# Patient Record
Sex: Male | Born: 1965 | Race: White | Hispanic: No | Marital: Married | Smoking: Never smoker
Health system: Southern US, Community
[De-identification: ages and names within clinical notes are randomized; demographics above are authoritative.]

## PROBLEM LIST (undated history)

## (undated) DIAGNOSIS — K219 Gastro-esophageal reflux disease without esophagitis: Secondary | ICD-10-CM

## (undated) DIAGNOSIS — N401 Enlarged prostate with lower urinary tract symptoms: Secondary | ICD-10-CM

## (undated) DIAGNOSIS — Z87442 Personal history of urinary calculi: Secondary | ICD-10-CM

## (undated) DIAGNOSIS — N400 Enlarged prostate without lower urinary tract symptoms: Secondary | ICD-10-CM

## (undated) DIAGNOSIS — I491 Atrial premature depolarization: Secondary | ICD-10-CM

## (undated) DIAGNOSIS — M5030 Other cervical disc degeneration, unspecified cervical region: Secondary | ICD-10-CM

## (undated) DIAGNOSIS — R002 Palpitations: Secondary | ICD-10-CM

## (undated) DIAGNOSIS — I77810 Thoracic aortic ectasia: Secondary | ICD-10-CM

## (undated) DIAGNOSIS — E785 Hyperlipidemia, unspecified: Secondary | ICD-10-CM

## (undated) DIAGNOSIS — R079 Chest pain, unspecified: Secondary | ICD-10-CM

## (undated) DIAGNOSIS — Z9289 Personal history of other medical treatment: Secondary | ICD-10-CM

## (undated) DIAGNOSIS — R7401 Elevation of levels of liver transaminase levels: Secondary | ICD-10-CM

## (undated) DIAGNOSIS — A419 Sepsis, unspecified organism: Secondary | ICD-10-CM

## (undated) DIAGNOSIS — I10 Essential (primary) hypertension: Secondary | ICD-10-CM

## (undated) DIAGNOSIS — J8410 Pulmonary fibrosis, unspecified: Secondary | ICD-10-CM

## (undated) DIAGNOSIS — R339 Retention of urine, unspecified: Secondary | ICD-10-CM

## (undated) DIAGNOSIS — I700 Atherosclerosis of aorta: Secondary | ICD-10-CM

## (undated) DIAGNOSIS — I493 Ventricular premature depolarization: Secondary | ICD-10-CM

## (undated) HISTORY — DX: Hyperlipidemia, unspecified: E78.5

## (undated) HISTORY — DX: Atherosclerosis of aorta: I70.0

## (undated) HISTORY — DX: Thoracic aortic ectasia: I77.810

## (undated) HISTORY — DX: Benign prostatic hyperplasia without lower urinary tract symptoms: N40.0

## (undated) HISTORY — PX: CERVICAL SPINE SURGERY: SHX589

## (undated) HISTORY — DX: Retention of urine, unspecified: R33.9

## (undated) HISTORY — DX: Elevation of levels of liver transaminase levels: R74.01

## (undated) HISTORY — DX: Ventricular premature depolarization: I49.3

## (undated) HISTORY — DX: Palpitations: R00.2

## (undated) HISTORY — DX: Sepsis, unspecified organism: A41.9

## (undated) HISTORY — DX: Essential (primary) hypertension: I10

## (undated) HISTORY — DX: Atrial premature depolarization: I49.1

## (undated) HISTORY — DX: Personal history of urinary calculi: Z87.442

## (undated) HISTORY — DX: Gastro-esophageal reflux disease without esophagitis: K21.9

---

## 1996-05-24 HISTORY — PX: ANTERIOR CERVICAL DECOMP/DISCECTOMY FUSION: SHX1161

## 1997-11-27 ENCOUNTER — Emergency Department (HOSPITAL_COMMUNITY): Admission: EM | Admit: 1997-11-27 | Discharge: 1997-11-27 | Payer: Self-pay | Admitting: Emergency Medicine

## 1998-05-24 HISTORY — PX: CERVICAL SPINE SURGERY: SHX589

## 2004-12-05 ENCOUNTER — Emergency Department (HOSPITAL_COMMUNITY): Admission: EM | Admit: 2004-12-05 | Discharge: 2004-12-05 | Payer: Self-pay | Admitting: Emergency Medicine

## 2006-04-29 ENCOUNTER — Emergency Department (HOSPITAL_COMMUNITY): Admission: EM | Admit: 2006-04-29 | Discharge: 2006-04-29 | Payer: Self-pay | Admitting: Family Medicine

## 2008-07-30 ENCOUNTER — Emergency Department (HOSPITAL_COMMUNITY): Admission: EM | Admit: 2008-07-30 | Discharge: 2008-07-30 | Payer: Self-pay | Admitting: Emergency Medicine

## 2008-08-02 ENCOUNTER — Ambulatory Visit: Payer: Self-pay | Admitting: Cardiovascular Disease

## 2008-08-02 ENCOUNTER — Encounter: Payer: Self-pay | Admitting: Cardiovascular Disease

## 2008-08-02 DIAGNOSIS — R079 Chest pain, unspecified: Secondary | ICD-10-CM

## 2008-08-02 DIAGNOSIS — R002 Palpitations: Secondary | ICD-10-CM | POA: Insufficient documentation

## 2008-08-12 ENCOUNTER — Telehealth (INDEPENDENT_AMBULATORY_CARE_PROVIDER_SITE_OTHER): Payer: Self-pay

## 2008-08-13 ENCOUNTER — Encounter: Payer: Self-pay | Admitting: Cardiovascular Disease

## 2008-08-13 ENCOUNTER — Ambulatory Visit: Payer: Self-pay | Admitting: Cardiovascular Disease

## 2008-08-13 ENCOUNTER — Ambulatory Visit: Payer: Self-pay

## 2008-08-20 DIAGNOSIS — I10 Essential (primary) hypertension: Secondary | ICD-10-CM | POA: Insufficient documentation

## 2008-08-20 DIAGNOSIS — K219 Gastro-esophageal reflux disease without esophagitis: Secondary | ICD-10-CM | POA: Insufficient documentation

## 2008-08-27 ENCOUNTER — Encounter: Payer: Self-pay | Admitting: Cardiovascular Disease

## 2008-08-27 ENCOUNTER — Ambulatory Visit: Payer: Self-pay | Admitting: Cardiovascular Disease

## 2008-08-27 DIAGNOSIS — I4949 Other premature depolarization: Secondary | ICD-10-CM | POA: Insufficient documentation

## 2009-09-26 IMAGING — CR DG CHEST 2V
2 series · 2 of 2 positions shown · non-contrast
Comparison: 04/29/2006.

CLINICAL DATA: 43-year-old male with chest pain.  Dizziness.

CHEST - 2 VIEW

[w chest pa]
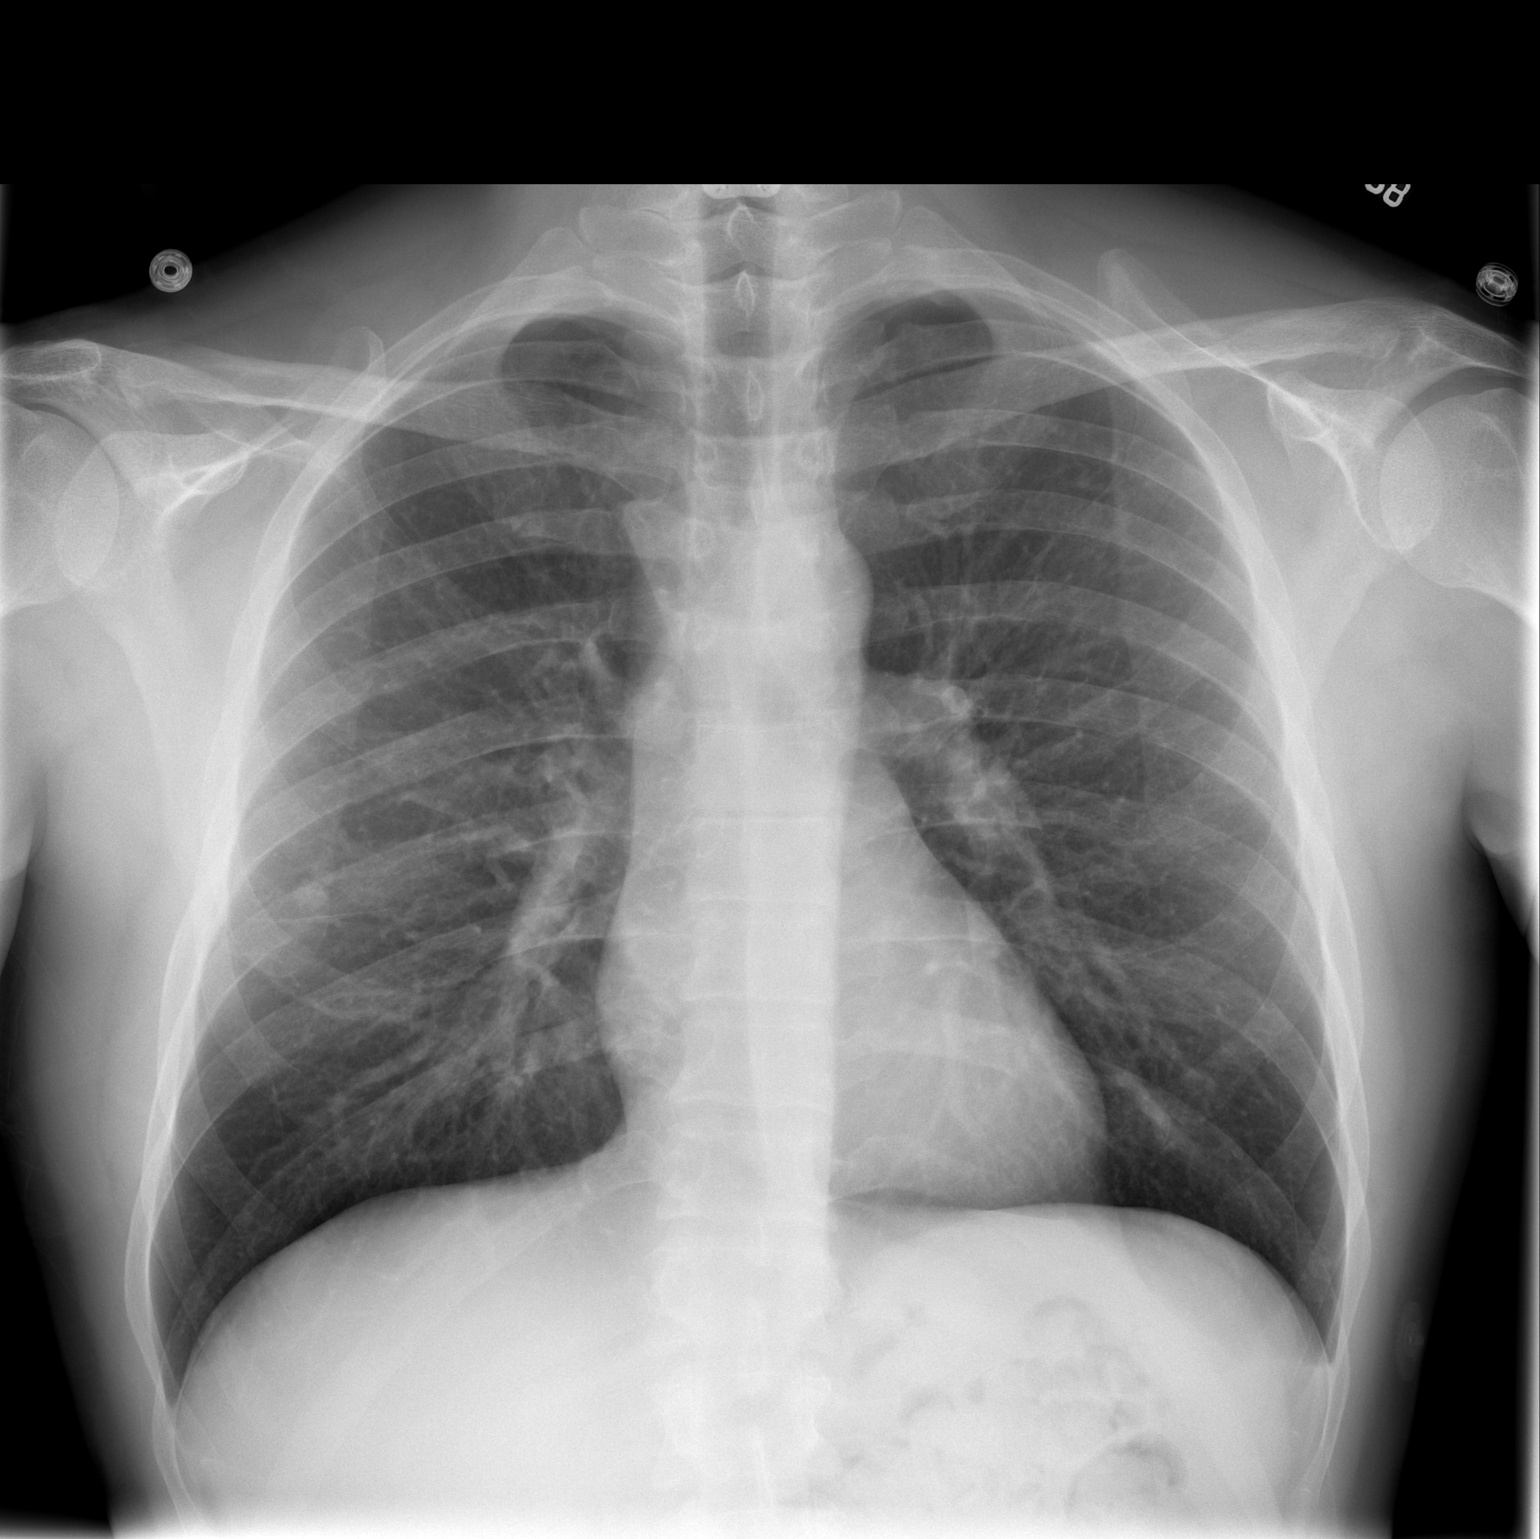

[w chest lat]
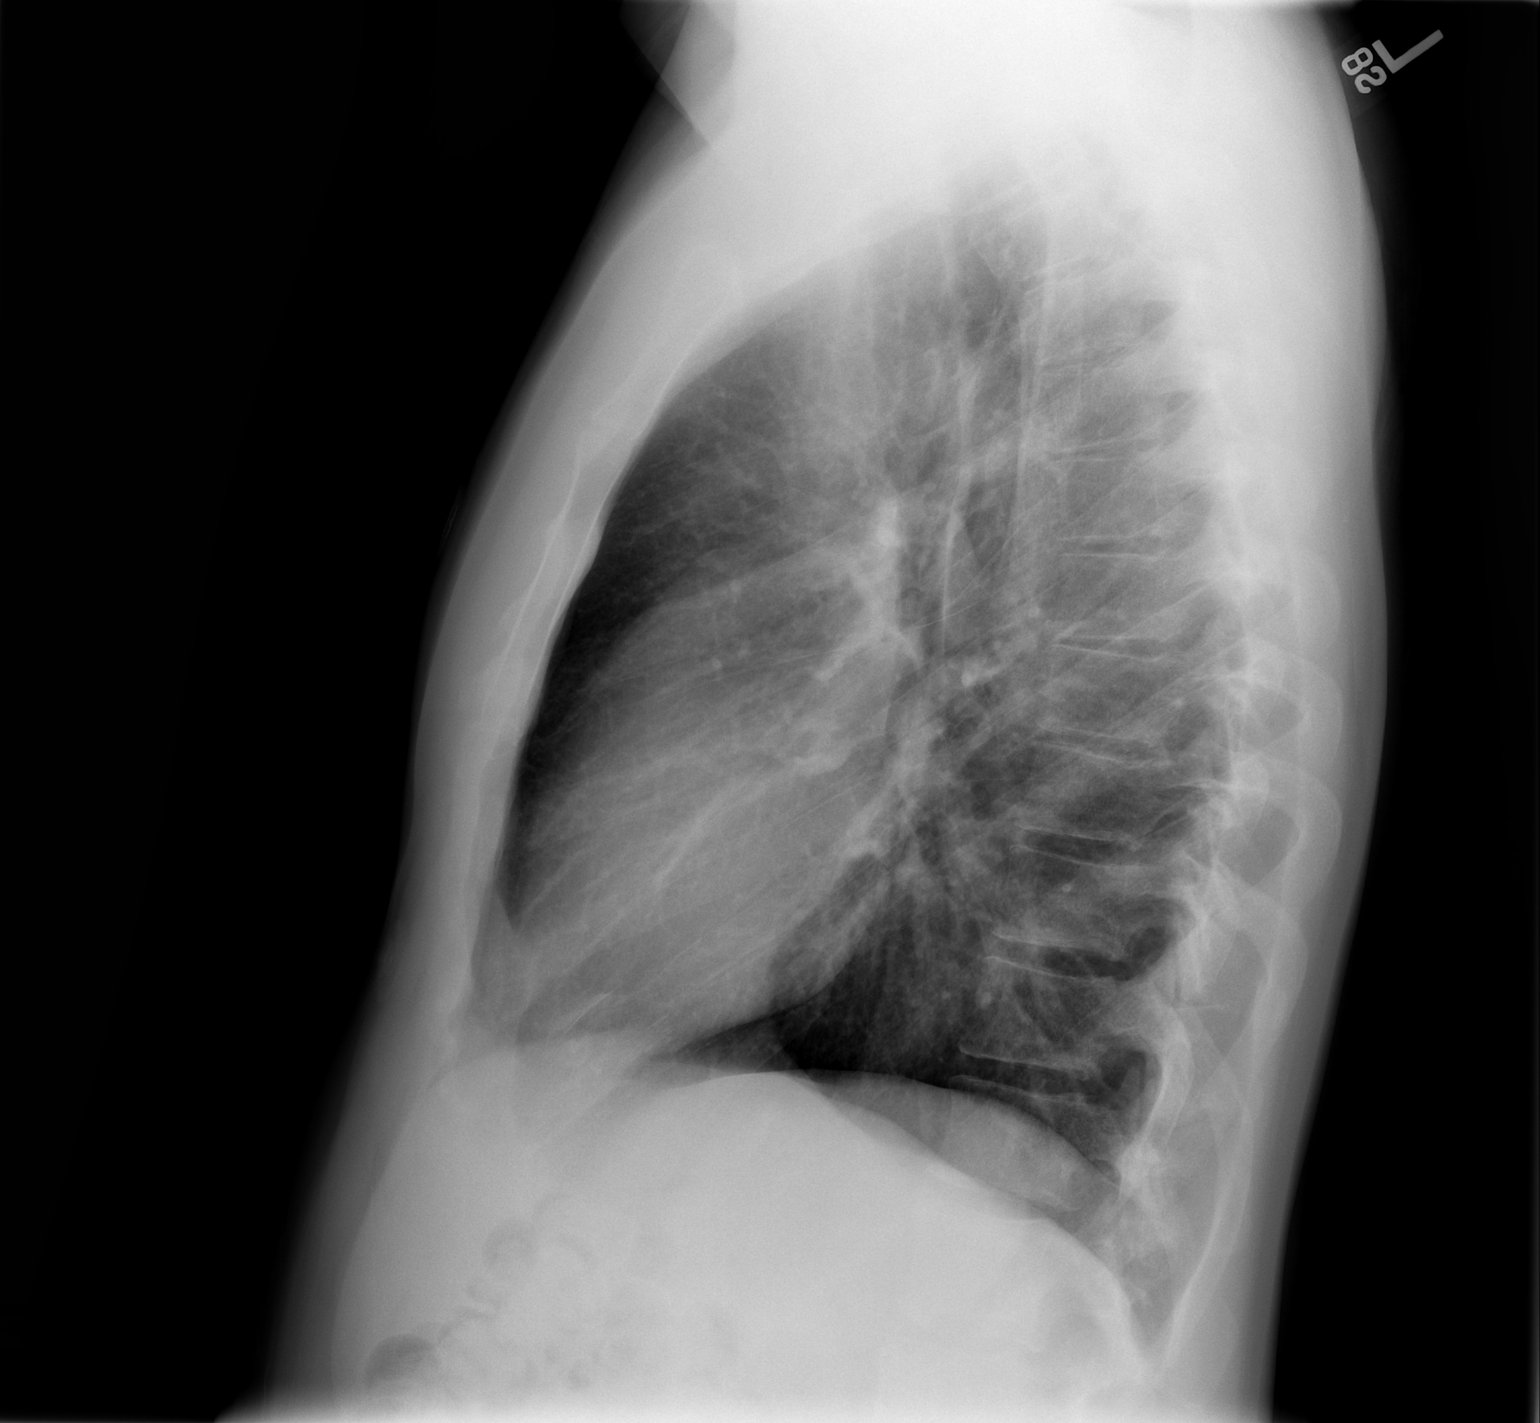

[2 of 2 positions shown; findings below may reference images not displayed]

FINDINGS: Lower cervical ACDF hardware.  Normal lung volumes.
Cardiac size and mediastinal contours are within normal limits.  No
pneumothorax, pulmonary edema, pleural effusion, consolidation or
confluent airspace opacity. Visualized tracheal air column is
within normal limits.  Stable 7 mm calcified lung nodule on the
right compatible with granuloma. No acute osseous abnormality
identified.
IMPRESSION: No acute cardiopulmonary abnormality.  Stable right lung granuloma.

## 2010-09-03 LAB — DIFFERENTIAL
Lymphocytes Relative: 14 % (ref 12–46)
Monocytes Relative: 7 % (ref 3–12)
Neutro Abs: 5.4 10*3/uL (ref 1.7–7.7)
Neutrophils Relative %: 78 % — ABNORMAL HIGH (ref 43–77)

## 2010-09-03 LAB — CBC
MCHC: 35.4 g/dL (ref 30.0–36.0)
RDW: 12 % (ref 11.5–15.5)
WBC: 6.9 10*3/uL (ref 4.0–10.5)

## 2010-09-03 LAB — POCT I-STAT, CHEM 8
BUN: 22 mg/dL (ref 6–23)
Calcium, Ion: 1.17 mmol/L (ref 1.12–1.32)
Chloride: 108 mEq/L (ref 96–112)
Glucose, Bld: 110 mg/dL — ABNORMAL HIGH (ref 70–99)
TCO2: 25 mmol/L (ref 0–100)

## 2010-09-03 LAB — POCT CARDIAC MARKERS: Myoglobin, poc: 57.5 ng/mL (ref 12–200)

## 2010-11-27 ENCOUNTER — Encounter: Payer: Self-pay | Admitting: Cardiovascular Disease

## 2010-11-27 ENCOUNTER — Emergency Department (HOSPITAL_BASED_OUTPATIENT_CLINIC_OR_DEPARTMENT_OTHER)
Admission: EM | Admit: 2010-11-27 | Discharge: 2010-11-27 | Disposition: A | Discharge status: Home / Self Care | Payer: 59 | Attending: Emergency Medicine | Admitting: Emergency Medicine

## 2010-11-27 DIAGNOSIS — Y92009 Unspecified place in unspecified non-institutional (private) residence as the place of occurrence of the external cause: Secondary | ICD-10-CM | POA: Insufficient documentation

## 2010-11-27 DIAGNOSIS — IMO0001 Reserved for inherently not codable concepts without codable children: Secondary | ICD-10-CM | POA: Insufficient documentation

## 2010-11-27 DIAGNOSIS — S61409A Unspecified open wound of unspecified hand, initial encounter: Secondary | ICD-10-CM | POA: Insufficient documentation

## 2015-02-23 ENCOUNTER — Emergency Department (HOSPITAL_BASED_OUTPATIENT_CLINIC_OR_DEPARTMENT_OTHER)
Admission: EM | Admit: 2015-02-23 | Discharge: 2015-02-23 | Disposition: A | Discharge status: Home / Self Care | Payer: 59 | Attending: Physician Assistant | Admitting: Physician Assistant

## 2015-02-23 ENCOUNTER — Encounter (HOSPITAL_BASED_OUTPATIENT_CLINIC_OR_DEPARTMENT_OTHER): Payer: Self-pay | Admitting: *Deleted

## 2015-02-23 DIAGNOSIS — R21 Rash and other nonspecific skin eruption: Secondary | ICD-10-CM | POA: Insufficient documentation

## 2015-02-23 DIAGNOSIS — Z7982 Long term (current) use of aspirin: Secondary | ICD-10-CM | POA: Diagnosis not present

## 2015-02-23 DIAGNOSIS — Z8719 Personal history of other diseases of the digestive system: Secondary | ICD-10-CM | POA: Diagnosis not present

## 2015-02-23 DIAGNOSIS — I10 Essential (primary) hypertension: Secondary | ICD-10-CM | POA: Diagnosis not present

## 2015-02-23 DIAGNOSIS — Z79899 Other long term (current) drug therapy: Secondary | ICD-10-CM | POA: Diagnosis not present

## 2015-02-23 MED ORDER — CALAMINE EX LOTN: 1.0000 "application " | TOPICAL_LOTION | CUTANEOUS | Status: DC | PRN

## 2015-02-23 MED ORDER — PREDNISONE 20 MG PO TABS: 60.0000 mg | ORAL_TABLET | Freq: Every day | ORAL | Status: DC

## 2015-02-23 NOTE — ED Provider Notes (Signed)
CSN: [id number]     Arrival date & time 02/23/15  1523 History   First MD Initiated Contact with Patient 02/23/15 1658     Chief Complaint  Patient presents with  . Rash     (Consider location/radiation/quality/duration/timing/severity/associated sxs/prior Treatment) HPI Comments: Pt is a 49 yo male who presents to the ED with complaint of rash. Pt reports having a small rash on his right forearm that started on Wednesday and notes he was doing yard work earlier that day. He then reports having a different rash to his buttocks, upper thighs and groin region that started yesterday. Endorses associated itching and burning. Denies fever, chills, myalgias, SOB, facial swelling, CP, abdominal pain, N/V, numbness, tingling. He reports using hydrocortisone cream at home with mild relief. He also states he used bleach on his arm rash and notes that it has worsened and become more red.   Patient is a 49 y.o. male presenting with rash.  Rash   Past Medical History  Diagnosis Date  . Chest pain   . Palpitations   . HTN (hypertension)   . GERD (gastroesophageal reflux disease)    Past Surgical History  Procedure Laterality Date  . Cervical spine surgery     No family history on file. Social History  Substance Use Topics  . Smoking status: Never Smoker   . Smokeless tobacco: Never Used     Comment: no tobacco   . Alcohol Use: No    Review of Systems  Skin: Positive for rash.  All other systems reviewed and are negative.     Allergies  Review of patient's allergies indicates no known allergies.  Home Medications   Prior to Admission medications   Medication Sig Start Date End Date Taking? Authorizing Provider  aspirin 81 MG tablet Take 81 mg by mouth daily.     Yes Historical Provider, MD  Multiple Vitamin (MULTIVITAMIN) tablet Take 1 tablet by mouth daily.     Yes Historical Provider, MD  Omega-3 Fatty Acids (FISH OIL) 1200 MG CAPS Take 1 capsule by mouth 2 (two) times daily.      Yes Historical Provider, MD   BP 142/86 mmHg  Pulse 62  Temp(Src) 98.1 F (36.7 C) (Oral)  Resp 16  Ht 5\' 10"  (1.778 m)  Wt 184 lb (83.462 kg)  BMI 26.40 kg/m2  SpO2 100% Physical Exam  Constitutional: He is oriented to person, place, and time. He appears well-developed and well-nourished.  HENT:  Head: Normocephalic and atraumatic.  Mouth/Throat: Oropharynx is clear and moist. No oropharyngeal exudate.  Eyes: Conjunctivae and EOM are normal. Pupils are equal, round, and reactive to light. Right eye exhibits no discharge. Left eye exhibits no discharge. No scleral icterus.  Neck: Normal range of motion. Neck supple.  Cardiovascular: Normal rate.   Pulmonary/Chest: Effort normal. No respiratory distress.  Neurological: He is alert and oriented to person, place, and time.  Skin: Skin is warm and dry. Rash noted.  3cm circular area of erythema with blanching located to the anterior aspect of mid right forearm with vesicles. Confluent macular blanching rash noted to bilateral buttocks, upper thigh and groin region, no vesicles, no papules, no drainage.  Nursing note and vitals reviewed.   ED Course  Procedures (including critical care time) Labs Review Labs Reviewed - No data to display  Imaging Review No results found. I have personally reviewed and evaluated these images and lab results as part of my medical decision-making.  Filed Vitals:   02/23/15 1902  BP: 140/99  Pulse: 63  Temp:   Resp: 16     MDM   Final diagnoses:  Rash   Pt presents with rash noted to his right forearm after doing yard work and another rash on his buttocks, upper thigh and groin area that started yesterday. Denies any new soaps, detergents, fabrics or irritants. Associated itching and burning. VSS. Erythematous area with vesicles noted to right forearm. Confluent macular blanching rash noted to buttocks/thigh/groin. I suspect arm rash is likely due to poison ivy and groin/thigh rash is likely  due to a contact dermatitis from an unknown irritant. Plan to d/c pt home on steorid taper and advised pt to use benadryl OTC and use calamine lotion for arm rash. Advised pt to follow up with his PCP.  Evaluation does not show pathology requring ongoing emergent intervention or admission. Pt is hemodynamically stable and mentating appropriately. Discussed findings/results and plan with patient/guardian, who agrees with plan. All questions answered. Return precautions discussed and outpatient follow up given.     Chesley Noon Hawi, PA-C 02/24/15 [address] [place], [place] 02/24/15 [place], [place] 02/27/15 [phone]

## 2015-02-23 NOTE — Discharge Instructions (Signed)
Please take your steroid taper as prescribed for 21 days. You may also take 25mg  benadryl for symptom relief. Also apply the Calamine lotion to the rash on your right arm as prescribed. Please follow up with your primary care provider in 3-4 days. Please return to the Emergency Department if symptoms worsen.

## 2015-02-23 NOTE — ED Notes (Signed)
Rash to arms, trunk, body. States worked in yard earlier in the week

## 2015-07-25 DIAGNOSIS — M8430XA Stress fracture, unspecified site, initial encounter for fracture: Secondary | ICD-10-CM | POA: Diagnosis not present

## 2015-07-25 DIAGNOSIS — M25572 Pain in left ankle and joints of left foot: Secondary | ICD-10-CM | POA: Diagnosis not present

## 2015-12-01 DIAGNOSIS — H11003 Unspecified pterygium of eye, bilateral: Secondary | ICD-10-CM | POA: Diagnosis not present

## 2017-11-10 ENCOUNTER — Ambulatory Visit (INDEPENDENT_AMBULATORY_CARE_PROVIDER_SITE_OTHER): Payer: Self-pay | Admitting: Nurse Practitioner

## 2017-11-10 VITALS — BP 130/88 | HR 67 | Temp 98.3°F | Resp 16 | Wt 178.6 lb

## 2017-11-10 DIAGNOSIS — Z0000 Encounter for general adult medical examination without abnormal findings: Secondary | ICD-10-CM

## 2017-11-10 NOTE — Progress Notes (Signed)
Subjective:  Eugene Brown is a 52 y.o. male who presents for basic physical exam.  The patient presents for his health assessment as a requirement for [place] to keep his insurance premiums low.  The patient denies any history of lung disease, kidney disease disease, liver disease, diabetes, asthma, seizures.  Has a history of hypertension, and PVCs, patient also has a history of GERD and palpitations.  The patient states right now he is only taking aspirin, and fish oil for his heart condition.  Patient states he has not had any recurrence of the PVCs in quite some time.  Patient is also managing his hypertension with diet.  Patient denies any current health related concerns today.  The patient's immunizations are up-to-date.  The patient has a surgical history of ACDF-in 1998.  The patient is married and has 2 sons.  The patient states 1 of his sons has diabetes.  The patient has a maternal family history which contains nothing at this time.  He states his father takes Coumadin, but not really sure as to why.  The patient denies recreational drug use, alcohol, and does not smoke.  Past Medical History:  Diagnosis Date  . Chest pain   . GERD (gastroesophageal reflux disease)   . HTN (hypertension)   . Palpitations     Past Surgical History:  Procedure Laterality Date  . CERVICAL SPINE SURGERY      Social History   Tobacco Use  . Smoking status: Never Smoker  . Smokeless tobacco: Never Used  . Tobacco comment: no tobacco   Substance Use Topics  . Alcohol use: No  . Drug use: No    No Known Allergies  Current Outpatient Medications  Medication Sig Dispense Refill  . aspirin 81 MG tablet Take 81 mg by mouth daily.      . Multiple Vitamin (MULTIVITAMIN) tablet Take 1 tablet by mouth daily.      . Omega-3 Fatty Acids (FISH OIL) 1200 MG CAPS Take 1 capsule by mouth 2 (two) times daily.      . calamine lotion Apply 1 application topically as needed for itching. (Patient not  taking: Reported on 11/10/2017) 120 mL 0  . predniSONE (DELTASONE) 20 MG tablet Take 3 tablets (60 mg total) by mouth daily. Take 60 mg by mouth daily for 7 days, then 40mg  by mouth daily for 7 days, then 20mg  daily for 7 days (Patient not taking: Reported on 11/10/2017) 42 tablet 0   No current facility-administered medications for this visit.     Review of Systems  Constitutional: Negative.   HENT: Negative.   Eyes: Negative.   Respiratory: Negative.   Cardiovascular: Negative.   Gastrointestinal: Negative.   Genitourinary: Negative.   Musculoskeletal: Negative.   Skin: Negative.   Neurological: Negative.   Endo/Heme/Allergies: Negative.   Psychiatric/Behavioral: Negative.    Objective:  BP 130/88 (BP Location: Right Arm, Patient Position: Sitting, Cuff Size: Normal)   Pulse 67   Temp 98.3 F (36.8 C) (Oral)   Resp 16   Wt 178 lb 9.6 oz (81 kg)   SpO2 97%   BMI 25.63 kg/m   General Appearance:  Alert, cooperative, no distress, appears stated age  Head:  Normocephalic, without obvious abnormality, atraumatic  Eyes:  PERRL, conjunctiva/corneas clear, EOM's intact, fundi benign, both eyes, pterygium to right pupil  Ears:  Normal TM's and external ear canals, both ears  Nose: Nares normal, septum midline, mucosa normal, no drainage or sinus tenderness  Throat:  Lips, mucosa, and tongue normal; teeth and gums normal  Neck: Supple, symmetrical, trachea midline, no adenopathy, thyroid: not enlarged, symmetric, no tenderness/mass/nodules, no carotid bruit or JVD  Back:   Symmetric, no curvature, ROM normal, no CVA tenderness  Lungs:   Clear to auscultation bilaterally, respirations unlabored  Chest Wall:  No tenderness or deformity  Heart:  Regular rate and rhythm, S1, S2 normal, no murmur, rub or gallop  Abdomen:   Soft, non-tender, bowel sounds active all four quadrants,  no masses, no organomegaly  Genitalia:  Deferred  Rectal:  Deferred  Extremities: Extremities normal,  atraumatic, no cyanosis or edema  Pulses: 2+ and symmetric  Skin: Skin color, texture, turgor normal, no rashes or lesions  Lymph nodes: Cervical, supraclavicular, and axillary nodes normal  Neurologic: Normal    Assessment:  basic physical exam    Plan:  Patient education provided.  Discussed with patient the need to follow-up with the PCP.  The patient was given the phone number for the patient engagement center.  Patient informed that once he establishes care with the PCP he can then get preventative screenings, and lab work.  Patient was also given education for preventive maintenance, and health prevention for his age group.  Verbalizes understanding and has no questions at time of discharge.

## 2017-11-10 NOTE — Patient Instructions (Signed)
Preventive Care 40-64 Years, Male  [place] -[phone] OR [phone] Preventive care refers to lifestyle choices and visits with your health care provider that can promote health and wellness. What does preventive care include?  A yearly physical exam. This is also called an annual well check.  Dental exams once or twice a year.  Routine eye exams. Ask your health care provider how often you should have your eyes checked.  Personal lifestyle choices, including: ? Daily care of your teeth and gums. ? Regular physical activity. ? Eating a healthy diet. ? Avoiding tobacco and drug use. ? Limiting alcohol use. ? Practicing safe sex. ? Taking low-dose aspirin every day starting at age 5. What happens during an annual well check? The services and screenings done by your health care provider during your annual well check will depend on your age, overall health, lifestyle risk factors, and family history of disease. Counseling Your health care provider may ask you questions about your:  Alcohol use.  Tobacco use.  Drug use.  Emotional well-being.  Home and relationship well-being.  Sexual activity.  Eating habits.  Work and work Statistician.  Screening You may have the following tests or measurements:  Height, weight, and BMI.  Blood pressure.  Lipid and cholesterol levels. These may be checked every 5 years, or more frequently if you are over 15 years old.  Skin check.  Lung cancer screening. You may have this screening every year starting at age 17 if you have a 30-pack-year history of smoking and currently smoke or have quit within the past 15 years.  Fecal occult blood test (FOBT) of the stool. You may have this test every year starting at age 3.  Flexible sigmoidoscopy or colonoscopy. You may have a sigmoidoscopy every 5 years or a colonoscopy every 10 years starting at age 56.  Prostate cancer screening. Recommendations will vary  depending on your family history and other risks.  Hepatitis C blood test.  Hepatitis B blood test.  Sexually transmitted disease (STD) testing.  Diabetes screening. This is done by checking your blood sugar (glucose) after you have not eaten for a while (fasting). You may have this done every 1-3 years.  Discuss your test results, treatment options, and if necessary, the need for more tests with your health care provider. Vaccines Your health care provider may recommend certain vaccines, such as:  Influenza vaccine. This is recommended every year.  Tetanus, diphtheria, and acellular pertussis (Tdap, Td) vaccine. You may need a Td booster every 10 years.  Varicella vaccine. You may need this if you have not been vaccinated.  Zoster vaccine. You may need this after age 55.  Measles, mumps, and rubella (MMR) vaccine. You may need at least one dose of MMR if you were born in 1957 or later. You may also need a second dose.  Pneumococcal 13-valent conjugate (PCV13) vaccine. You may need this if you have certain conditions and have not been vaccinated.  Pneumococcal polysaccharide (PPSV23) vaccine. You may need one or two doses if you smoke cigarettes or if you have certain conditions.  Meningococcal vaccine. You may need this if you have certain conditions.  Hepatitis A vaccine. You may need this if you have certain conditions or if you travel or work in places where you may be exposed to hepatitis A.  Hepatitis B vaccine. You may need this if you have certain conditions or if you travel or work in places where you may be exposed to hepatitis B.  Haemophilus influenzae type b (Hib) vaccine. You may need this if you have certain risk factors.  Talk to your health care provider about which screenings and vaccines you need and how often you need them. This information is not intended to replace advice given to you by your health care provider. Make sure you discuss any questions you have  with your health care provider. Document Released: 06/06/2015 Document Revised: 01/28/2016 Document Reviewed: 03/11/2015 Elsevier Interactive Patient Education  2018 [place] Maintenance, Male A healthy lifestyle and preventive care is important for your health and wellness. Ask your health care provider about what schedule of regular examinations is right for you. What should I know about weight and diet? Eat a Healthy Diet  Eat plenty of vegetables, fruits, whole grains, low-fat dairy products, and lean protein.  Do not eat a lot of foods high in solid fats, added sugars, or salt.  Maintain a Healthy Weight Regular exercise can help you achieve or maintain a healthy weight. You should:  Do at least 150 minutes of exercise each week. The exercise should increase your heart rate and make you sweat (moderate-intensity exercise).  Do strength-training exercises at least twice a week.  Watch Your Levels of Cholesterol and Blood Lipids  Have your blood tested for lipids and cholesterol every 5 years starting at 54 years of age. If you are at high risk for heart disease, you should start having your blood tested when you are 54 years old. You may need to have your cholesterol levels checked more often if: ? Your lipid or cholesterol levels are high. ? You are older than 54 years of age. ? You are at high risk for heart disease.  What should I know about cancer screening? Many types of cancers can be detected early and may often be prevented. Lung Cancer  You should be screened every year for lung cancer if: ? You are a current smoker who has smoked for at least 30 years. ? You are a former smoker who has quit within the past 15 years.  Talk to your health care provider about your screening options, when you should start screening, and how often you should be screened.  Colorectal Cancer  Routine colorectal cancer screening usually begins at 54 years of age and should  be repeated every 5-10 years until you are 54 years old. You may need to be screened more often if early forms of precancerous polyps or small growths are found. Your health care provider may recommend screening at an earlier age if you have risk factors for colon cancer.  Your health care provider may recommend using home test kits to check for hidden blood in the stool.  A small camera at the end of a tube can be used to examine your colon (sigmoidoscopy or colonoscopy). This checks for the earliest forms of colorectal cancer.  Prostate and Testicular Cancer  Depending on your age and overall health, your health care provider may do certain tests to screen for prostate and testicular cancer.  Talk to your health care provider about any symptoms or concerns you have about testicular or prostate cancer.  Skin Cancer  Check your skin from head to toe regularly.  Tell your health care provider about any new moles or changes in moles, especially if: ? There is a change in a mole's size, shape, or color. ? You have a mole that is larger than a pencil eraser.  Always use sunscreen. Apply sunscreen liberally  and repeat throughout the day.  Protect yourself by wearing long sleeves, pants, a wide-brimmed hat, and sunglasses when outside.  What should I know about heart disease, diabetes, and high blood pressure?  If you are 62-6 years of age, have your blood pressure checked every 3-5 years. If you are 37 years of age or older, have your blood pressure checked every year. You should have your blood pressure measured twice-once when you are at a hospital or clinic, and once when you are not at a hospital or clinic. Record the average of the two measurements. To check your blood pressure when you are not at a hospital or clinic, you can use: ? An automated blood pressure machine at a pharmacy. ? A home blood pressure monitor.  Talk to your health care provider about your target blood  pressure.  If you are between 5-79 years old, ask your health care provider if you should take aspirin to prevent heart disease.  Have regular diabetes screenings by checking your fasting blood sugar level. ? If you are at a normal weight and have a low risk for diabetes, have this test once every three years after the age of 29. ? If you are overweight and have a high risk for diabetes, consider being tested at a younger age or more often.  A one-time screening for abdominal aortic aneurysm (AAA) by ultrasound is recommended for men aged 69-75 years who are current or former smokers. What should I know about preventing infection? Hepatitis B If you have a higher risk for hepatitis B, you should be screened for this virus. Talk with your health care provider to find out if you are at risk for hepatitis B infection. Hepatitis C Blood testing is recommended for:  Everyone born from [birth year] through Mar 21, 1966.  Anyone with known risk factors for hepatitis C.  Sexually Transmitted Diseases (STDs)  You should be screened each year for STDs including gonorrhea and chlamydia if: ? You are sexually active and are younger than 54 years of age. ? You are older than 53 years of age and your health care provider tells you that you are at risk for this type of infection. ? Your sexual activity has changed since you were last screened and you are at an increased risk for chlamydia or gonorrhea. Ask your health care provider if you are at risk.  Talk with your health care provider about whether you are at high risk of being infected with HIV. Your health care provider may recommend a prescription medicine to help prevent HIV infection.  What else can I do?  Schedule regular health, dental, and eye exams.  Stay current with your vaccines (immunizations).  Do not use any tobacco products, such as cigarettes, chewing tobacco, and e-cigarettes. If you need help quitting, ask your health care  provider.  Limit alcohol intake to no more than 2 drinks per day. One drink equals 12 ounces of beer, 5 ounces of wine, or 1 ounces of hard liquor.  Do not use street drugs.  Do not share needles.  Ask your health care provider for help if you need support or information about quitting drugs.  Tell your health care provider if you often feel depressed.  Tell your health care provider if you have ever been abused or do not feel safe at home. This information is not intended to replace advice given to you by your health care provider. Make sure you discuss any questions you have with your health  care provider. Document Released: 11/06/2007 Document Revised: 01/07/2016 Document Reviewed: 02/11/2015 Elsevier Interactive Patient Education  [employer].

## 2018-10-18 ENCOUNTER — Ambulatory Visit (INDEPENDENT_AMBULATORY_CARE_PROVIDER_SITE_OTHER): Payer: Self-pay | Admitting: Physician Assistant

## 2018-10-18 ENCOUNTER — Other Ambulatory Visit: Payer: Self-pay

## 2018-10-18 VITALS — BP 140/90 | HR 63 | Temp 98.3°F | Resp 16 | Ht 71.0 in | Wt 181.2 lb

## 2018-10-18 DIAGNOSIS — Z0000 Encounter for general adult medical examination without abnormal findings: Secondary | ICD-10-CM

## 2018-10-18 DIAGNOSIS — T148XXA Other injury of unspecified body region, initial encounter: Secondary | ICD-10-CM

## 2018-10-18 DIAGNOSIS — R21 Rash and other nonspecific skin eruption: Secondary | ICD-10-CM

## 2018-10-18 DIAGNOSIS — H6121 Impacted cerumen, right ear: Secondary | ICD-10-CM

## 2018-10-18 MED ORDER — MUPIROCIN 2 % EX OINT
1.0000 | TOPICAL_OINTMENT | Freq: Three times a day (TID) | CUTANEOUS | 0 refills | Status: DC
Start: 2018-10-18 — End: 2021-06-01

## 2018-10-18 NOTE — Progress Notes (Signed)
Eugene Brown  MRN: [id number] DOB: [birth year]  Subjective:  Pt is a 53 y.o. male who presents for basic physical exam for work. 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 Does not have a PCP but does have basic labs drawn annually. Last had them a few months ago and notes everything was normal. Has PMH of GERD, "borderline HTN vs white coat HTN," PVCs-was followed by cardiology for this but after having normal stress test and echo was released. Tries to control bp with diet and exercise. Takes baby Asprin and fish oil daily. Denies PMH of DM, heart disease, kidney disease, and autoimmune disease.    Exercise and nutrition: For exercise he rides stationary bike often and walks at least 10,000 steps daily. For diet, focuses on low carb low sugar diet. Will drink wine infrequently.   Social:  Married with 2 children. Sexually active with monogamous wife. No concern for STDs. Denies tobacco and ilicit drug use.   Last dental exam: every 6 months Last vision exam: annually, wears Rx eyeglasses, optho is Dr. Dione Booze: Last colonoscopy: never-has a friend who is a GI doctor and he is planning on having colonoscopy after covid pandemic calms down Vaccinations      Tetanus: w/in past 10 years       Influenza: 2019   Patient Active Problem List   Diagnosis Date Noted  . PREMATURE VENTRICULAR CONTRACTIONS 08/27/2008  . HYPERTENSION, UNSPECIFIED 08/20/2008  . GERD 08/20/2008  . PALPITATIONS 08/02/2008  . CHEST PAIN-UNSPECIFIED 08/02/2008    Current Outpatient Medications on File Prior to Visit  Medication Sig Dispense Refill  . aspirin 81 MG tablet Take 81 mg by mouth daily.      . Multiple Vitamin (MULTIVITAMIN) tablet Take 1 tablet by mouth daily.      . Omega-3 Fatty Acids (FISH OIL) 1200 MG CAPS Take 1 capsule by mouth 2 (two) times daily.       No current facility-administered medications on file prior to visit.     No Known Allergies  Social History   Socioeconomic History  .  Marital status: Married    Spouse name: Not on file  . Number of children: Not on file  . Years of education: Not on file  . Highest education level: Not on file  Occupational History  . Not on file  Social Needs  . Financial resource strain: Not on file  . Food insecurity:    Worry: Not on file    Inability: Not on file  . Transportation needs:    Medical: Not on file    Non-medical: Not on file  Tobacco Use  . Smoking status: Never Smoker  . Smokeless tobacco: Never Used  . Tobacco comment: no tobacco   Substance and Sexual Activity  . Alcohol use: No  . Drug use: No  . Sexual activity: Not on file  Lifestyle  . Physical activity:    Days per week: Not on file    Minutes per session: Not on file  . Stress: Not on file  Relationships  . Social connections:    Talks on phone: Not on file    Gets together: Not on file    Attends religious service: Not on file    Active member of club or organization: Not on file    Attends meetings of clubs or organizations: Not on file    Relationship status: Not on file  Other Topics Concern  . Not on file  Social  History Narrative   Married, 2 children.    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    No significant caffeine use; no otc supplements     Past Surgical History:  Procedure Laterality Date  . CERVICAL SPINE SURGERY      No family history on file.  Review of Systems  Constitutional: Negative for chills, diaphoresis and fever.  HENT: Negative for congestion and sore throat.   Eyes: Negative for visual disturbance.  Respiratory: Negative for cough and shortness of breath.   Cardiovascular: Negative for chest pain, palpitations and leg swelling.  Gastrointestinal: Negative for abdominal pain, nausea and vomiting.  Musculoskeletal: Negative for back pain.  Skin: Positive for rash (on back of neck x 1 week, occured after working in the yard, initially itched, but now it is not bothering him, has not tried anything  for this, no other new exposures) and wound (on left side of scalp x 1 day, noticed it after putting on old outdoor work glasses last night, noticed bleeding from the site, no surrouding erythema, warmth, pain, or purulent drainage, has been applying neosporin).  Neurological: Negative for dizziness, light-headedness and headaches.  Psychiatric/Behavioral: Negative for agitation, dysphoric mood and suicidal ideas. The patient is not nervous/anxious.     Objective:  BP 140/90 (BP Location: Right Arm, Patient Position: Sitting, Cuff Size: Normal)   Pulse 63   Temp 98.3 F (36.8 C) (Oral)   Resp 16   Ht 5\' 11"  (1.803 m)   Wt 181 lb 3.2 oz (82.2 kg)   SpO2 97%   BMI 25.27 kg/m   Physical Exam Vitals signs reviewed.  Constitutional:      General: He is not in acute distress.    Appearance: Normal appearance. He is not ill-appearing or toxic-appearing.  HENT:     Head: Normocephalic and atraumatic.     Right Ear: Hearing and external ear normal.     Left Ear: Hearing, tympanic membrane, ear canal and external ear normal.     Ears:     Comments: Right ear canal impacted with copious amount of dark brown dry cerumen, unable to visualize TM.  Post right ear lavage, right ear canal is clear.  TM visualized and normal in appearance.  No erythema, bulging, or perforation of right TM noted.      Nose: Nose normal.     Mouth/Throat:     Pharynx: Uvula midline. No oropharyngeal exudate.  Eyes:     General: Lids are normal.     Extraocular Movements: Extraocular movements intact.     Conjunctiva/sclera:     Right eye: Right conjunctiva is not injected.     Left eye: Left conjunctiva is not injected.     Pupils: Pupils are equal, round, and reactive to light.   Neck:     Musculoskeletal: Normal range of motion.     Trachea: Trachea normal.  Cardiovascular:     Rate and Rhythm: Normal rate and regular rhythm.     Heart sounds: Normal heart sounds.  Pulmonary:     Effort: Pulmonary  effort is normal.     Breath sounds: Normal breath sounds.  Abdominal:     General: Bowel sounds are normal.     Palpations: Abdomen is soft.  Musculoskeletal: Normal range of motion.  Lymphadenopathy:     Head:     Right side of head: No submental, submandibular, tonsillar, preauricular, posterior auricular or occipital adenopathy.     Left side of head:  No submental, submandibular, tonsillar, preauricular, posterior auricular or occipital adenopathy.     Cervical: No cervical adenopathy.     Upper Body:     Right upper body: No supraclavicular adenopathy.     Left upper body: No supraclavicular adenopathy.  Skin:    General: Skin is warm and dry.     Findings: Abrasion (~2 mm abrasion with overlying dried blood noted on left lateral scalp, no surrounding erythema, warmth, or purulent drainage; no TTP ) and rash (Small area of erythematous vesiculopapular lesions in streak-like configuration on right posterior neck) present.     Comments:    Neurological:     Mental Status: He is alert and oriented to person, place, and time.     Gait: Gait is intact.     Deep Tendon Reflexes: Reflexes are normal and symmetric.     Comments: Strength of b/l upper and lower ext 5/5     Hearing Screening   125Hz  250Hz  500Hz  1000Hz  2000Hz  3000Hz  4000Hz  6000Hz  8000Hz   Right ear:   Pass Pass Pass  Pass    Left ear:   Pass Pass Pass  Pass      Visual Acuity Screening   Right eye Left eye Both eyes  Without correction: 20/30 20/30 20/20   With correction: 20/20 20/20 20/20     BP Readings from Last 3 Encounters:  10/18/18 140/90  11/10/17 130/88  02/23/15 140/99   Assessment and Plan :  1. Physical exam Patient was given education for preventive maintenance and health prevention for his age group. Informed pt that we do not obtain lab work at our office. Rec follow up with PCP for basic lab work and health maintenance updates. Pt agrees to do so.   2. Abrasion Minor skin abrasion noted on left  scalp. No signs of secondary infection. Given advice on wound care. Cleanse with soap and water daily. Apply mupirocin ointment BID. Keep covered until fully healed. Return if develops any signs of surrounding infection.  - mupirocin ointment (BACTROBAN) 2 %; Apply 1 application topically 3 (three) times daily.  Dispense: 22 g; Refill: 0  3. Rash Consistent with poison ivy dermatitis. Small area involved. Offered Rx for topical corticosteroid but pt voices he has some at home that he can use. Follow up if needed.   4. Impacted cerumen of right ear Resolved w/ ear lavage.  - Ear Lavage   Benjiman Core, PA-C  [facility] 10/18/2018 5:12 PM

## 2018-10-18 NOTE — Patient Instructions (Signed)
Preventive Care 40-64 Years, Male   For scalp abrasion, apply mupirocin ointment twice daily. Cleanse daily with soap and water. If you develop any surrounding redness, warmth, pain, fever, chills or pus drainage, please return for further evaluation.   For rash on the back-use over the counter steroid cream to help clear it up.  Continue healthy lifestyle. Follow up with GI for colonoscopy. Thanks for visiting [facility] for your basic physical!     Preventive care refers to lifestyle choices and visits with your health care provider that can promote health and wellness. What does preventive care include?   A yearly physical exam. This is also called an annual well check.  Dental exams once or twice a year.  Routine eye exams. Ask your health care provider how often you should have your eyes checked.  Personal lifestyle choices, including: ? Daily care of your teeth and gums. ? Regular physical activity. ? Eating a healthy diet. ? Avoiding tobacco and drug use. ? Limiting alcohol use. ? Practicing safe sex. ? Taking low-dose aspirin every day starting at age 101. What happens during an annual well check? The services and screenings done by your health care provider during your annual well check will depend on your age, overall health, lifestyle risk factors, and family history of disease. Counseling Your health care provider may ask you questions about your:  Alcohol use.  Tobacco use.  Drug use.  Emotional well-being.  Home and relationship well-being.  Sexual activity.  Eating habits.  Work and work Statistician. Screening You may have the following tests or measurements:  Height, weight, and BMI.  Blood pressure.  Lipid and cholesterol levels. These may be checked every 5 years, or more frequently if you are over 40 years old.  Skin check.  Lung cancer screening. You may have this screening every year starting at age 48 if you have a 30-pack-year history  of smoking and currently smoke or have quit within the past 15 years.  Colorectal cancer screening. All adults should have this screening starting at age 43 and continuing until age 4. Your health care provider may recommend screening at age 67. You will have tests every 1-10 years, depending on your results and the type of screening test. People at increased risk should start screening at an earlier age. Screening tests may include: ? Guaiac-based fecal occult blood testing. ? Fecal immunochemical test (FIT). ? Stool DNA test. ? Virtual colonoscopy. ? Sigmoidoscopy. During this test, a flexible tube with a tiny camera (sigmoidoscope) is used to examine your rectum and lower colon. The sigmoidoscope is inserted through your anus into your rectum and lower colon. ? Colonoscopy. During this test, a long, thin, flexible tube with a tiny camera (colonoscope) is used to examine your entire colon and rectum.  Prostate cancer screening. Recommendations will vary depending on your family history and other risks.  Hepatitis C blood test.  Hepatitis B blood test.  Sexually transmitted disease (STD) testing.  Diabetes screening. This is done by checking your blood sugar (glucose) after you have not eaten for a while (fasting). You may have this done every 1-3 years. Discuss your test results, treatment options, and if necessary, the need for more tests with your health care provider. Vaccines Your health care provider may recommend certain vaccines, such as:  Influenza vaccine. This is recommended every year.  Tetanus, diphtheria, and acellular pertussis (Tdap, Td) vaccine. You may need a Td booster every 10 years.  Varicella vaccine. You may need  this if you have not been vaccinated.  Zoster vaccine. You may need this after age 1.  Measles, mumps, and rubella (MMR) vaccine. You may need at least one dose of MMR if you were born in 1957 or later. You may also need a second dose.  Pneumococcal  13-valent conjugate (PCV13) vaccine. You may need this if you have certain conditions and have not been vaccinated.  Pneumococcal polysaccharide (PPSV23) vaccine. You may need one or two doses if you smoke cigarettes or if you have certain conditions.  Meningococcal vaccine. You may need this if you have certain conditions.  Hepatitis A vaccine. You may need this if you have certain conditions or if you travel or work in places where you may be exposed to hepatitis A.  Hepatitis B vaccine. You may need this if you have certain conditions or if you travel or work in places where you may be exposed to hepatitis B.  Haemophilus influenzae type b (Hib) vaccine. You may need this if you have certain risk factors. Talk to your health care provider about which screenings and vaccines you need and how often you need them. This information is not intended to replace advice given to you by your health care provider. Make sure you discuss any questions you have with your health care provider. Document Released: 06/06/2015 Document Revised: 06/30/2017 Document Reviewed: 03/11/2015 Elsevier Interactive Patient Education  2019 [employer].

## 2019-07-10 DIAGNOSIS — L570 Actinic keratosis: Secondary | ICD-10-CM | POA: Diagnosis not present

## 2019-07-10 DIAGNOSIS — L821 Other seborrheic keratosis: Secondary | ICD-10-CM | POA: Diagnosis not present

## 2021-06-01 ENCOUNTER — Ambulatory Visit: Payer: 59 | Admitting: Cardiovascular Disease

## 2021-06-01 ENCOUNTER — Other Ambulatory Visit (HOSPITAL_COMMUNITY): Payer: Self-pay

## 2021-06-01 ENCOUNTER — Other Ambulatory Visit: Payer: Self-pay

## 2021-06-01 ENCOUNTER — Ambulatory Visit (INDEPENDENT_AMBULATORY_CARE_PROVIDER_SITE_OTHER): Payer: 59

## 2021-06-01 ENCOUNTER — Encounter: Payer: Self-pay | Admitting: Cardiovascular Disease

## 2021-06-01 VITALS — BP 186/84 | HR 75 | Ht 71.0 in | Wt 189.0 lb

## 2021-06-01 DIAGNOSIS — I10 Essential (primary) hypertension: Secondary | ICD-10-CM

## 2021-06-01 DIAGNOSIS — R002 Palpitations: Secondary | ICD-10-CM | POA: Diagnosis not present

## 2021-06-01 MED ORDER — METOPROLOL SUCCINATE ER 25 MG PO TB24
25.0000 mg | ORAL_TABLET | Freq: Every day | ORAL | 3 refills | Status: DC
  Filled 2021-06-01: qty 90, 90d supply, fill #0
  Filled 2021-08-27: qty 90, 90d supply, fill #1
  Filled 2021-11-04 – 2021-11-23 (×2): qty 90, 90d supply, fill #2
  Filled 2022-02-18: qty 90, 90d supply, fill #3

## 2021-06-01 MED ORDER — METOPROLOL SUCCINATE ER 25 MG PO TB24: 25.0000 mg | ORAL_TABLET | Freq: Every day | ORAL | 3 refills | Status: DC

## 2021-06-01 NOTE — Progress Notes (Signed)
Chief Complaint  Patient presents with   New Patient (Initial Visit)    Palpitations, HTN   History of Present Illness: 56 yo male with history of GERD, HTN and palpitations who is here today as a new patient for evaluation of palpitations and elevated blood pressure. I saw him in 2010 for evaluation of chest pain. Echo and stress testing at that time were normal. Cardiac monitor in 2010 with PVCs/PACs. He tells me now that he has had palpitations at night only for 2 months. This occurs every night. He feels early beats, worse when lying on his left side. He is very active and has no awareness of skipped beats during the day at work. No exercise intolerance. No chest pain or dyspnea. BP elevated at the dentist several week ago.   Primary Care Physician: Pcp, No   Past Medical History:  Diagnosis Date   GERD (gastroesophageal reflux disease)    HTN (hypertension)     Past Surgical History:  Procedure Laterality Date   CERVICAL SPINE SURGERY      Current Outpatient Medications  Medication Sig Dispense Refill   aspirin 81 MG tablet Take 81 mg by mouth daily.       Multiple Vitamin (MULTIVITAMIN) tablet Take 1 tablet by mouth daily.       Omega-3 Fatty Acids (FISH OIL) 1200 MG CAPS Take 1 capsule by mouth 2 (two) times daily.       metoprolol succinate (TOPROL XL) 25 MG 24 hr tablet Take 1 tablet (25 mg total) by mouth daily. 90 tablet 3   No current facility-administered medications for this visit.    No Known Allergies  Social History   Socioeconomic History   Marital status: Married    Spouse name: Not on file   Number of children: 2   Years of education: Not on file   Highest education level: Not on file  Occupational History   Occupation: Surveyor, minerals at [employer]  Tobacco Use   Smoking status: Never   Smokeless tobacco: Never   Tobacco comments:    no tobacco   Substance and Sexual Activity   Alcohol use: No   Drug use: No   Sexual activity: Not on file   Other Topics Concern   Not on file  Social History Narrative   Married, 2 children.    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    No significant caffeine use; no otc supplements    Social Determinants of Radio broadcast assistant Strain: Not on file  Food Insecurity: Not on file  Transportation Needs: Not on file  Physical Activity: Not on file  Stress: Not on file  Social Connections: Not on file  Intimate Partner Violence: Not on file    Family History  Problem Relation Age of Onset   CAD Mother        CABG   Atrial fibrillation Father     Review of Systems:  As stated in the HPI and otherwise negative.   BP (!) 186/84    Pulse 75    Ht 5\' 11"  (1.803 m)    Wt 189 lb (85.7 kg)    SpO2 98%    BMI 26.36 kg/m   Physical Examination: General: Well developed, well nourished, NAD  HEENT: OP clear, mucus membranes moist  SKIN: warm, dry. No rashes. Neuro: No focal deficits  Musculoskeletal: Muscle strength 5/5 all ext  Psychiatric: Mood and affect normal  Neck: No JVD, no  carotid bruits, no thyromegaly, no lymphadenopathy.  Lungs:Clear bilaterally, no wheezes, rhonci, crackles Cardiovascular: Regular rate and rhythm. No murmurs, gallops or rubs. Abdomen:Soft. Bowel sounds present. Non-tender.  Extremities: No lower extremity edema. Pulses are 2 + in the bilateral DP/PT.  EKG:  EKG is ordered today. The ekg ordered today demonstrates Sinus  Recent Labs: No results found for requested labs within last 8760 hours.   Lipid Panel No results found for: CHOL, TRIG, HDL, CHOLHDL, VLDL, LDLCALC, LDLDIRECT   Wt Readings from Last 3 Encounters:  06/01/21 189 lb (85.7 kg)  10/18/18 181 lb 3.2 oz (82.2 kg)  11/10/17 178 lb 9.6 oz (81 kg)      Assessment and Plan:   1. Palpitations: Likely premature beats. Will arrange an echo and a 3 day Zio patch monitor. Check BMET and TSH  2. HTN: Will start Toprol 25 mg daily. I hope that if there are premature beats this will  help with that also. If it is not effective for his BP may have to change to Cardizem CD or consider adding an ARB to the Toprol.    Current medicines are reviewed at length with the patient today.  The patient does not have concerns regarding medicines.  The following changes have been made:  no change  Labs/ tests ordered today include:   Orders Placed This Encounter  Procedures   Basic Metabolic Panel (BMET)   TSH   LONG TERM MONITOR (3-14 DAYS)   EKG 12-Lead   ECHOCARDIOGRAM COMPLETE     Disposition:   F/U with me or office APP in 6-8 weeks.    Signed, Lauree Chandler, MD 06/01/2021 4:43 PM    [facility] [address], [place], [place]  [address] Phone: [phone]; Fax: [phone]

## 2021-06-01 NOTE — Progress Notes (Unsigned)
[id number] 3 day ZIO XT applied from office inventory.

## 2021-06-01 NOTE — Patient Instructions (Signed)
Medication Instructions:  Your physician has recommended you make the following change in your medication:  1.) start Toprol XL 25 mg one tablet daily  *If you need a refill on your cardiac medications before your next appointment, please call your pharmacy*   Lab Work: Today: BMET, TSH   Testing/Procedures: Your physician has requested that you have an echocardiogram. Echocardiography is a painless test that uses sound waves to create images of your heart. It provides your doctor with information about the size and shape of your heart and how well your hearts chambers and valves are working. This procedure takes approximately one hour. There are no restrictions for this procedure.  3 day Zio Heart Patch - see instructions below.   Follow-Up: At [facility], you and your health needs are our priority.  As part of our continuing mission to provide you with exceptional heart care, we have created designated Provider Care Teams.  These Care Teams include your primary Cardiologist (physician) and Advanced Practice Providers (APPs -  Physician Assistants and Nurse Practitioners) who all work together to provide you with the care you need, when you need it.  Your next appointment:   6-8 week(s)  The format for your next appointment:   In Person  Provider:   Lauree Chandler, MD    Other Instructions Bryn Gulling- Long Term Monitor Instructions  Your physician has requested you wear a ZIO patch monitor for 14 days.  This is a single patch monitor. [employer] supplies one patch monitor per enrollment. Additional stickers are not available. Please do not apply patch if you will be having a Nuclear Stress Test,  Echocardiogram, Cardiac CT, MRI, or Chest Xray during the period you would be wearing the  monitor. The patch cannot be worn during these tests. You cannot remove and re-apply the  ZIO XT patch monitor.  Your ZIO patch monitor will be mailed 3 day USPS to your address on file. It  may take 3-5 days  to receive your monitor after you have been enrolled.  Once you have received your monitor, please review the enclosed instructions. Your monitor  has already been registered assigning a specific monitor serial # to you.  Billing and Patient Assistance Program Information  We have supplied [employer] with any of your insurance information on file for billing purposes. [employer] offers a sliding scale Patient Assistance Program for patients that do not have  insurance, or whose insurance does not completely cover the cost of the ZIO monitor.  You must apply for the Patient Assistance Program to qualify for this discounted rate.  To apply, please call [employer] at [phone], select option 4, select option 2, ask to apply for  Patient Assistance Program. Theodore Demark will ask your household income, and how many people  are in your household. They will quote your out-of-pocket cost based on that information.  [employer] will also be able to set up a 14-month, interest-free payment plan if needed.  Applying the monitor   Shave hair from upper left chest.  Hold abrader disc by orange tab. Rub abrader in 40 strokes over the upper left chest as  indicated in your monitor instructions.  Clean area with 4 enclosed alcohol pads. Let dry.  Apply patch as indicated in monitor instructions. Patch will be placed under collarbone on left  side of chest with arrow pointing upward.  Rub patch adhesive wings for 2 minutes. Remove white label marked "1". Remove the white  label marked "2". Rub patch adhesive wings  for 2 additional minutes.  While looking in a mirror, press and release button in center of patch. A small green light will  flash 3-4 times. This will be your only indicator that the monitor has been turned on.  Do not shower for the first 24 hours. You may shower after the first 24 hours.  Press the button if you feel a symptom. You will hear a small click. Record Date, Time and  Symptom  in the Patient Logbook.  When you are ready to remove the patch, follow instructions on the last 2 pages of Patient  Logbook. Stick patch monitor onto the last page of Patient Logbook.  Place Patient Logbook in the blue and white box. Use locking tab on box and tape box closed  securely. The blue and white box has prepaid postage on it. Please place it in the mailbox as  soon as possible. Your physician should have your test results approximately 7 days after the  monitor has been mailed back to [facility].  Call [place] at [phone] if you have questions regarding  your ZIO XT patch monitor. Call them immediately if you see an orange light blinking on your  monitor.  If your monitor falls off in less than 4 days, contact our Monitor department at [phone].  If your monitor becomes loose or falls off after 4 days call [employer] at [phone] for  suggestions on securing your monitor

## 2021-06-02 LAB — BASIC METABOLIC PANEL
BUN/Creatinine Ratio: 17 (ref 9–20)
BUN: 17 mg/dL (ref 6–24)
CO2: 23 mmol/L (ref 20–29)
Calcium: 9.5 mg/dL (ref 8.7–10.2)
Chloride: 104 mmol/L (ref 96–106)
Creatinine, Ser: 0.99 mg/dL (ref 0.76–1.27)
Glucose: 88 mg/dL (ref 70–99)
Potassium: 4.5 mmol/L (ref 3.5–5.2)
Sodium: 140 mmol/L (ref 134–144)
eGFR: 89 mL/min/{1.73_m2} (ref >59)

## 2021-06-02 LAB — TSH: TSH: 5.58 u[IU]/mL — ABNORMAL HIGH (ref 0.450–4.500)

## 2021-06-09 ENCOUNTER — Other Ambulatory Visit: Payer: Self-pay

## 2021-06-09 ENCOUNTER — Ambulatory Visit (HOSPITAL_COMMUNITY): Payer: 59 | Attending: Cardiovascular Disease

## 2021-06-09 DIAGNOSIS — I10 Essential (primary) hypertension: Secondary | ICD-10-CM | POA: Insufficient documentation

## 2021-06-09 DIAGNOSIS — I77810 Thoracic aortic ectasia: Secondary | ICD-10-CM

## 2021-06-09 DIAGNOSIS — R002 Palpitations: Secondary | ICD-10-CM | POA: Insufficient documentation

## 2021-06-09 HISTORY — DX: Thoracic aortic ectasia: I77.810

## 2021-06-09 LAB — ECHOCARDIOGRAM COMPLETE
Area-P 1/2: 2.54 cm2
S' Lateral: 3 cm

## 2021-06-11 DIAGNOSIS — R002 Palpitations: Secondary | ICD-10-CM | POA: Diagnosis not present

## 2021-06-11 DIAGNOSIS — I10 Essential (primary) hypertension: Secondary | ICD-10-CM | POA: Diagnosis not present

## 2021-07-21 ENCOUNTER — Ambulatory Visit (INDEPENDENT_AMBULATORY_CARE_PROVIDER_SITE_OTHER): Payer: 59 | Admitting: Cardiovascular Disease

## 2021-07-21 ENCOUNTER — Encounter: Payer: Self-pay | Admitting: Cardiovascular Disease

## 2021-07-21 ENCOUNTER — Other Ambulatory Visit: Payer: Self-pay

## 2021-07-21 VITALS — BP 152/70 | HR 60 | Ht 71.0 in | Wt 189.0 lb

## 2021-07-21 DIAGNOSIS — I493 Ventricular premature depolarization: Secondary | ICD-10-CM | POA: Diagnosis not present

## 2021-07-21 DIAGNOSIS — R002 Palpitations: Secondary | ICD-10-CM

## 2021-07-21 DIAGNOSIS — I10 Essential (primary) hypertension: Secondary | ICD-10-CM

## 2021-07-21 NOTE — Patient Instructions (Signed)
Medication Instructions:  ?No changes ?*If you need a refill on your cardiac medications before your next appointment, please call your pharmacy* ? ? ?Lab Work: ?none ?If you have labs (blood work) drawn today and your tests are completely normal, you will receive your results only by: ?MyChart Message (if you have MyChart) OR ?A paper copy in the mail ?If you have any lab test that is abnormal or we need to change your treatment, we will call you to review the results. ? ? ?Testing/Procedures: ?none ? ? ?Follow-Up: ?At [facility], you and your health needs are our priority.  As part of our continuing mission to provide you with exceptional heart care, we have created designated Provider Care Teams.  These Care Teams include your primary Cardiologist (physician) and Advanced Practice Providers (APPs -  Physician Assistants and Nurse Practitioners) who all work together to provide you with the care you need, when you need it. ? ?We recommend signing up for the patient portal called "MyChart".  Sign up information is provided on this After Visit Summary.  MyChart is used to connect with patients for Virtual Visits (Telemedicine).  Patients are able to view lab/test results, encounter notes, upcoming appointments, etc.  Non-urgent messages can be sent to your provider as well.   ?To learn more about what you can do with MyChart, go to [email or web address].   ? ?Your next appointment:   ?12 month(s) ? ?The format for your next appointment:   ?In Person ? ?Provider:   ?Christopher McAlhany, MD   ? ? ?Other Instructions ?  ?

## 2021-07-21 NOTE — Progress Notes (Signed)
Chief Complaint  Patient presents with   Follow-up    Palpitations    History of Present Illness: 56 yo male with history of GERD, HTN and palpitations who is here today for follow up. I saw him as a new patient for evaluation of palpitations and elevated blood pressure in January 2023. I had seen him in 2010 for evaluation of chest pain. Echo and stress testing at that time were normal. Cardiac monitor in 2010 with PVCs/PACs. At his visit here in January 2023 he described palpitations at night for 2 months. He is very active and has no awareness of skipped beats during the day at work. No exercise intolerance. No chest pain or dyspnea. BP elevated at the dentist several weeks prior to that visit. Echo 06/09/21 with LVEF=60-65%. Normal RV function. No significant valve disease. Cardiac monitor with sinus with PVCs, PACs. TSH mildly elevated.   He is here today for follow up. The patient denies any chest pain, dyspnea, palpitations, lower extremity edema, orthopnea, PND, dizziness, near syncope or syncope.   Primary Care Physician: Pcp, No  Past Medical History:  Diagnosis Date   GERD (gastroesophageal reflux disease)    HTN (hypertension)     Past Surgical History:  Procedure Laterality Date   CERVICAL SPINE SURGERY      Current Outpatient Medications  Medication Sig Dispense Refill   aspirin 81 MG tablet Take 81 mg by mouth daily.       metoprolol succinate (TOPROL XL) 25 MG 24 hr tablet Take 1 tablet (25 mg total) by mouth daily. 90 tablet 3   Multiple Vitamin (MULTIVITAMIN) tablet Take 1 tablet by mouth daily.       Omega-3 Fatty Acids (FISH OIL) 1200 MG CAPS Take 1 capsule by mouth 2 (two) times daily.       No current facility-administered medications for this visit.    No Known Allergies  Social History   Socioeconomic History   Marital status: Married    Spouse name: Not on file   Number of children: 2   Years of education: Not on file   Highest education level:  Not on file  Occupational History   Occupation: Surveyor, minerals at [employer]  Tobacco Use   Smoking status: Never   Smokeless tobacco: Never   Tobacco comments:    no tobacco   Substance and Sexual Activity   Alcohol use: No   Drug use: No   Sexual activity: Not on file  Other Topics Concern   Not on file  Social History Narrative   Married, 2 children.    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    No significant caffeine use; no otc supplements    Social Determinants of Radio broadcast assistant Strain: Not on file  Food Insecurity: Not on file  Transportation Needs: Not on file  Physical Activity: Not on file  Stress: Not on file  Social Connections: Not on file  Intimate Partner Violence: Not on file    Family History  Problem Relation Age of Onset   CAD Mother        CABG   Atrial fibrillation Father     Review of Systems:  As stated in the HPI and otherwise negative.   BP (!) 152/70    Pulse 60    Ht 5\' 11"  (1.803 m)    Wt 189 lb (85.7 kg)    SpO2 98%    BMI 26.36 kg/m   Physical  Examination: General: Well developed, well nourished, NAD  HEENT: OP clear, mucus membranes moist  SKIN: warm, dry. No rashes. Neuro: No focal deficits  Musculoskeletal: Muscle strength 5/5 all ext  Psychiatric: Mood and affect normal  Neck: No JVD, no carotid bruits, no thyromegaly, no lymphadenopathy.  Lungs:Clear bilaterally, no wheezes, rhonci, crackles Cardiovascular: Regular rate and rhythm. No murmurs, gallops or rubs. Abdomen:Soft. Bowel sounds present. Non-tender.  Extremities: No lower extremity edema. Pulses are 2 + in the bilateral DP/PT.  EKG:  EKG is not ordered today. The ekg ordered today demonstrates   Echo 06/09/21:  1. Left ventricular ejection fraction, by estimation, is 60 to 65%. Left  ventricular ejection fraction by 3D volume is 60 %. The left ventricle has  normal function. The left ventricle has no regional wall motion  abnormalities. Left  ventricular diastolic   parameters were normal. The average left ventricular global longitudinal  strain is -22.0 %. The global longitudinal strain is normal.   2. Right ventricular systolic function is normal. The right ventricular  size is normal.   3. The mitral valve is normal in structure. Trivial mitral valve  regurgitation. No evidence of mitral stenosis.   4. The aortic valve is normal in structure. Aortic valve regurgitation is  not visualized. No aortic stenosis is present.   5. Aortic dilatation noted. There is borderline dilatation of the aortic  root, measuring 38 mm. There is mild dilatation of the ascending aorta,  measuring 40 mm.   6. The inferior vena cava is normal in size with greater than 50%  respiratory variability, suggesting right atrial pressure of 3 mmHg.   Recent Labs: 06/01/2021: BUN 17; Creatinine, Ser 0.99; Potassium 4.5; Sodium 140; TSH 5.580   Lipid Panel No results found for: CHOL, TRIG, HDL, CHOLHDL, VLDL, LDLCALC, LDLDIRECT   Wt Readings from Last 3 Encounters:  07/21/21 189 lb (85.7 kg)  06/01/21 189 lb (85.7 kg)  10/18/18 181 lb 3.2 oz (82.2 kg)      Assessment and Plan:   1. Palpitations/PACs/PVCs:  No palpitations since starting Toprol. Continue Toprol.   2. HTN: BP is controlled at home. Continue Toprol.   3. Hypothyroidism: He has an appt to see primary care to discuss this.   Current medicines are reviewed at length with the patient today.  The patient does not have concerns regarding medicines.  The following changes have been made:  no change  Labs/ tests ordered today include:   No orders of the defined types were placed in this encounter.    Disposition:   F/U with me in one year   Signed, Lauree Chandler, MD 07/21/2021 3:59 PM    [facility] [address], [place], [place]  [address] Phone: [phone]; Fax: [phone]

## 2021-08-20 ENCOUNTER — Other Ambulatory Visit (HOSPITAL_COMMUNITY): Payer: Self-pay

## 2021-08-20 ENCOUNTER — Ambulatory Visit: Payer: 59 | Admitting: Family Medicine

## 2021-08-20 ENCOUNTER — Encounter: Payer: Self-pay | Admitting: Family Medicine

## 2021-08-20 VITALS — BP 142/86 | HR 60 | Temp 98.0°F | Resp 17 | Ht 71.0 in | Wt 186.8 lb

## 2021-08-20 DIAGNOSIS — R7989 Other specified abnormal findings of blood chemistry: Secondary | ICD-10-CM | POA: Diagnosis not present

## 2021-08-20 DIAGNOSIS — R002 Palpitations: Secondary | ICD-10-CM | POA: Diagnosis not present

## 2021-08-20 DIAGNOSIS — Z0000 Encounter for general adult medical examination without abnormal findings: Secondary | ICD-10-CM

## 2021-08-20 DIAGNOSIS — Z125 Encounter for screening for malignant neoplasm of prostate: Secondary | ICD-10-CM

## 2021-08-20 DIAGNOSIS — Z131 Encounter for screening for diabetes mellitus: Secondary | ICD-10-CM

## 2021-08-20 DIAGNOSIS — Z13220 Encounter for screening for lipoid disorders: Secondary | ICD-10-CM

## 2021-08-20 DIAGNOSIS — Z1211 Encounter for screening for malignant neoplasm of colon: Secondary | ICD-10-CM

## 2021-08-20 DIAGNOSIS — I10 Essential (primary) hypertension: Secondary | ICD-10-CM

## 2021-08-20 LAB — LIPID PANEL
Cholesterol: 220 mg/dL — ABNORMAL HIGH (ref 0–200)
HDL: 43.5 mg/dL (ref >39.00)
LDL Cholesterol: 142 mg/dL — ABNORMAL HIGH (ref 0–99)
NonHDL: 176.08
Total CHOL/HDL Ratio: 5
Triglycerides: 169 mg/dL — ABNORMAL HIGH (ref 0.0–149.0)
VLDL: 33.8 mg/dL (ref 0.0–40.0)

## 2021-08-20 LAB — CBC
HCT: 48 % (ref 39.0–52.0)
Hemoglobin: 16.4 g/dL (ref 13.0–17.0)
MCHC: 34.2 g/dL (ref 30.0–36.0)
MCV: 85.9 fl (ref 78.0–100.0)
Platelets: 213 10*3/uL (ref 150.0–400.0)
RBC: 5.59 Mil/uL (ref 4.22–5.81)
RDW: 13 % (ref 11.5–15.5)
WBC: 7.1 10*3/uL (ref 4.0–10.5)

## 2021-08-20 LAB — COMPREHENSIVE METABOLIC PANEL
ALT: 53 U/L (ref 0–53)
AST: 34 U/L (ref 0–37)
Albumin: 5.3 g/dL — ABNORMAL HIGH (ref 3.5–5.2)
Alkaline Phosphatase: 71 U/L (ref 39–117)
BUN: 23 mg/dL (ref 6–23)
CO2: 27 mEq/L (ref 19–32)
Calcium: 10 mg/dL (ref 8.4–10.5)
Chloride: 103 mEq/L (ref 96–112)
Creatinine, Ser: 1.23 mg/dL (ref 0.40–1.50)
GFR: 64.88 mL/min (ref >60.00)
Glucose, Bld: 84 mg/dL (ref 70–99)
Potassium: 4.3 mEq/L (ref 3.5–5.1)
Sodium: 141 mEq/L (ref 135–145)
Total Bilirubin: 0.6 mg/dL (ref 0.2–1.2)
Total Protein: 7.5 g/dL (ref 6.0–8.3)

## 2021-08-20 LAB — PSA: PSA: 13.39 ng/mL — ABNORMAL HIGH (ref 0.10–4.00)

## 2021-08-20 MED ORDER — LOSARTAN POTASSIUM 25 MG PO TABS
25.0000 mg | ORAL_TABLET | Freq: Every day | ORAL | 1 refills | Status: DC
  Filled 2021-08-20: qty 90, 90d supply, fill #0
  Filled 2021-11-04: qty 90, 90d supply, fill #1

## 2021-08-20 NOTE — Patient Instructions (Addendum)
Add losartan low dose for blood pressure. Continue toprol.  ?I will recheck thyroid levels today to decide if meds or monitoring needed.  Follow-up in 6 months for blood pressure, repeat labs.  Let me know if there are questions sooner.  Thanks for coming in today.  ? ? ?Preventive Care 41-58 Years Old, Male ?Preventive care refers to lifestyle choices and visits with your health care provider that can promote health and wellness. Preventive care visits are also called wellness exams. ?What can I expect for my preventive care visit? ?Counseling ?During your preventive care visit, your health care provider may ask about your: ?Medical history, including: ?Past medical problems. ?Family medical history. ?Current health, including: ?Emotional well-being. ?Home life and relationship well-being. ?Sexual activity. ?Lifestyle, including: ?Alcohol, nicotine or tobacco, and drug use. ?Access to firearms. ?Diet, exercise, and sleep habits. ?Safety issues such as seatbelt and bike helmet use. ?Sunscreen use. ?Work and work Statistician. ?Physical exam ?Your health care provider will check your: ?Height and weight. These may be used to calculate your BMI (body mass index). BMI is a measurement that tells if you are at a healthy weight. ?Waist circumference. This measures the distance around your waistline. This measurement also tells if you are at a healthy weight and may help predict your risk of certain diseases, such as type 2 diabetes and high blood pressure. ?Heart rate and blood pressure. ?Body temperature. ?Skin for abnormal spots. ?What immunizations do I need? ?Vaccines are usually given at various ages, according to a schedule. Your health care provider will recommend vaccines for you based on your age, medical history, and lifestyle or other factors, such as travel or where you work. ?What tests do I need? ?Screening ?Your health care provider may recommend screening tests for certain conditions. This may  include: ?Lipid and cholesterol levels. ?Diabetes screening. This is done by checking your blood sugar (glucose) after you have not eaten for a while (fasting). ?Hepatitis B test. ?Hepatitis C test. ?HIV (human immunodeficiency virus) test. ?STI (sexually transmitted infection) testing, if you are at risk. ?Lung cancer screening. ?Prostate cancer screening. ?Colorectal cancer screening. ?Talk with your health care provider about your test results, treatment options, and if necessary, the need for more tests. ?Follow these instructions at home: ?Eating and drinking ? ?Eat a diet that includes fresh fruits and vegetables, whole grains, lean protein, and low-fat dairy products. ?Take vitamin and mineral supplements as recommended by your health care provider. ?Do not drink alcohol if your health care provider tells you not to drink. ?If you drink alcohol: ?Limit how much you have to 0-2 drinks a day. ?Know how much alcohol is in your drink. In the [place] one drink equals one 12 oz bottle of beer (355 mL), one 5 oz glass of wine (148 mL), or one 1? oz glass of hard liquor (44 mL). ?Lifestyle ?Brush your teeth every morning and night with fluoride toothpaste. Floss one time each day. ?Exercise for at least 30 minutes 5 or more days each week. ?Do not use any products that contain nicotine or tobacco. These products include cigarettes, chewing tobacco, and vaping devices, such as e-cigarettes. If you need help quitting, ask your health care provider. ?Do not use drugs. ?If you are sexually active, practice safe sex. Use a condom or other form of protection to prevent STIs. ?Take aspirin only as told by your health care provider. Make sure that you understand how much to take and what form to take. Work with your health  care provider to find out whether it is safe and beneficial for you to take aspirin daily. ?Find healthy ways to manage stress, such as: ?Meditation, yoga, or listening to music. ?Journaling. ?Talking to a  trusted person. ?Spending time with friends and family. ?Minimize exposure to UV radiation to reduce your risk of skin cancer. ?Safety ?Always wear your seat belt while driving or riding in a vehicle. ?Do not drive: ?If you have been drinking alcohol. Do not ride with someone who has been drinking. ?When you are tired or distracted. ?While texting. ?If you have been using any mind-altering substances or drugs. ?Wear a helmet and other protective equipment during sports activities. ?If you have firearms in your house, make sure you follow all gun safety procedures. ?What's next? ?Go to your health care provider once a year for an annual wellness visit. ?Ask your health care provider how often you should have your eyes and teeth checked. ?Stay up to date on all vaccines. ?This information is not intended to replace advice given to you by your health care provider. Make sure you discuss any questions you have with your health care provider. ?Document Revised: 11/05/2020 Document Reviewed: 11/05/2020 ?Elsevier Patient Education ? [place]. ? ? ?Managing Your Hypertension ?Hypertension, also called high blood pressure, is when the force of the blood pressing against the walls of the arteries is too strong. Arteries are blood vessels that carry blood from your heart throughout your body. Hypertension forces the heart to work harder to pump blood and may cause the arteries to become narrow or stiff. ?Understanding blood pressure readings ?Your personal target blood pressure may vary depending on your medical conditions, your age, and other factors. A blood pressure reading includes a higher number over a lower number. Ideally, your blood pressure should be below 120/80. You should know that: ?The first, or top, number is called the systolic pressure. It is a measure of the pressure in your arteries as your heart beats. ?The second, or bottom number, is called the diastolic pressure. It is a measure of the pressure  in your arteries as the heart relaxes. ?Blood pressure is classified into four stages. Based on your blood pressure reading, your health care provider may use the following stages to determine what type of treatment you need, if any. Systolic pressure and diastolic pressure are measured in a unit called mmHg. ?Normal ?Systolic pressure: below 938. ?Diastolic pressure: below 80. ?Elevated ?Systolic pressure: 101-751. ?Diastolic pressure: below 80. ?Hypertension stage 1 ?Systolic pressure: 025-852. ?Diastolic pressure: 77-82. ?Hypertension stage 2 ?Systolic pressure: 423 or above. ?Diastolic pressure: 90 or above. ?How can this condition affect me? ?Managing your hypertension is an important responsibility. Over time, hypertension can damage the arteries and decrease blood flow to important parts of the body, including the brain, heart, and kidneys. Having untreated or uncontrolled hypertension can lead to: ?A heart attack. ?A stroke. ?A weakened blood vessel (aneurysm). ?Heart failure. ?Kidney damage. ?Eye damage. ?Metabolic syndrome. ?Memory and concentration problems. ?Vascular dementia. ?What actions can I take to manage this condition? ?Hypertension can be managed by making lifestyle changes and possibly by taking medicines. Your health care provider will help you make a plan to bring your blood pressure within a normal range. ?Nutrition ? ?Eat a diet that is high in fiber and potassium, and low in salt (sodium), added sugar, and fat. An example eating plan is called the Dietary Approaches to Stop Hypertension (DASH) diet. To eat this way: ?Eat plenty of fresh  fruits and vegetables. Try to fill one-half of your plate at each meal with fruits and vegetables. ?Eat whole grains, such as whole-wheat pasta, brown rice, or whole-grain bread. Fill about one-fourth of your plate with whole grains. ?Eat low-fat dairy products. ?Avoid fatty cuts of meat, processed or cured meats, and poultry with skin. Fill about  one-fourth of your plate with lean proteins such as fish, chicken without skin, beans, eggs, and tofu. ?Avoid pre-made and processed foods. These tend to be higher in sodium, added sugar, and fat. ?Reduce your daily sodiu

## 2021-08-20 NOTE — Progress Notes (Signed)
? ?Subjective:  ?Patient ID: Eugene Brown, male    DOB: 10-24-1965  Age: 56 y.o. MRN: [id number] ? ?CC:  ?Chief Complaint  ?Patient presents with  ? New Patient (Initial Visit)  ?  Pt here for establish care notes he does need a physical for his insuarance if at all possible today, notes no other concerns today   ? Health Maintenance  ?  Pt is due for colonoscopy and has agreed to the cologaurd testing   ? ? ?HPI ?Eugene Brown presents for  ? ?New patient establish care and physical.  History of GERD, palpitations, hypertension.  Takes Toprol-XL 25 mg daily.  ?PA with PMR at [facility]  ?No recent PCP.  ?Cardiology: McAlhany ? ?Married, relationship going well. No concerns with sexual health.  ? ?Hypertension: ?Seen by cardiology prior. Started on toprol 25mg  qd in January - Dr. Clifton James.  ?Palpitations better. Last visit 2/28. Echo reassuring in January.  ?Home readings: 138/75.  ?Planned assessment for BP changes with cardiology. Considering low dose losartan.  ?FH of afib - father.  ?BP Readings from Last 3 Encounters:  ?08/20/21 (!) 142/86  ?07/21/21 (!) 152/70  ?06/01/21 (!) 186/84  ? ?Lab Results  ?Component Value Date  ? CREATININE 0.99 06/01/2021  ? ?Hypothyroidism: ?Lab Results  ?Component Value Date  ? TSH 5.580 (H) 06/01/2021  ?No hx of thryoid nodules, radiation to nek or thyroid surgery.  ?No FH of thyroid disease ?No new hot or cold intolerance. No new hair or skin changes, some fatigue with toprol.  Fatigue. No new weight changes.  ? ? ?  08/20/2021  ?  9:34 AM 11/10/2017  ?  5:50 PM  ?Depression screen PHQ 2/9  ?Decreased Interest 3 0  ?Down, Depressed, Hopeless 0 0  ?PHQ - 2 Score 3 0  ?Altered sleeping 0 0  ?Tired, decreased energy 0 0  ?Change in appetite 0 0  ?Feeling bad or failure about yourself  0 0  ?Trouble concentrating 0 0  ?Moving slowly or fidgety/restless 0 0  ?Suicidal thoughts 0 0  ?PHQ-9 Score 3 0  ?Denies depression/anhedonia.  ? ?Balanced diet. No fast food.  ?Soda/sweet tea -  none.  ? ?Cancer screening: ?Screening options with colonoscopy versus Cologuard discussed. Discussed timing of repeat testing intervals if normal, as well as potential need for diagnostic Colonoscopy if positive Cologuard. Understanding expressed, and chose Cologuard. No FH of colon CA.  ?No FH of prostate CA.  ?The natural history of prostate cancer and ongoing controversy regarding screening and potential treatment outcomes of prostate cancer has been discussed with the patient. The meaning of a false positive PSA and a false negative PSA has been discussed. He indicates understanding of the limitations of this screening test and wishes to proceed with screening PSA testing. ?No FH of skin CA.  ?Derm Donzetta Starch, prior cutaneous horn removed.  ? ?There is no immunization history on file for this patient. ?Flu vaccine and covid vaccine utd.  ? ?Optho: ?[facility] - wears glasses. Pterygium being monitored.  ? ?Dentist every 6 months.  ? ?No alcohol ? ?No tobacco ? ?Exercise 3 days/week - 35-40  ? ? ?History ?Patient Active Problem List  ? Diagnosis Date Noted  ? PREMATURE VENTRICULAR CONTRACTIONS 08/27/2008  ? HYPERTENSION, UNSPECIFIED 08/20/2008  ? GERD 08/20/2008  ? PALPITATIONS 08/02/2008  ? CHEST PAIN-UNSPECIFIED 08/02/2008  ? ?Past Medical History:  ?Diagnosis Date  ? GERD (gastroesophageal reflux disease)   ? HTN (hypertension)   ? ?  Past Surgical History:  ?Procedure Laterality Date  ? CERVICAL SPINE SURGERY    ? ?No Known Allergies ?Prior to Admission medications   ?Medication Sig Start Date End Date Taking? Authorizing Provider  ?aspirin 81 MG tablet Take 81 mg by mouth daily.     Yes [provider]  ?metoprolol succinate (TOPROL XL) 25 MG 24 hr tablet Take 1 tablet (25 mg total) by mouth daily. 06/01/21  Yes Kathleene Hazel, MD  ?Multiple Vitamin (MULTIVITAMIN) tablet Take 1 tablet by mouth daily.     Yes [provider]  ?Omega-3 Fatty Acids (FISH OIL) 1200 MG CAPS Take 1  capsule by mouth 2 (two) times daily.     Yes [provider]  ? ?Social History  ? ?Socioeconomic History  ? Marital status: Married  ?  Spouse name: Not on file  ? Number of children: 2  ? Years of education: Not on file  ? Highest education level: Not on file  ?Occupational History  ? Occupation: Doctor, general practice at [employer]  ?Tobacco Use  ? Smoking status: Never  ? Smokeless tobacco: Never  ? Tobacco comments:  ?  no tobacco   ?Substance and Sexual Activity  ? Alcohol use: No  ? Drug use: No  ? Sexual activity: Yes  ?Other Topics Concern  ? Not on file  ?Social History Narrative  ? Married, 2 children.   ? 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   ? No significant caffeine use; no otc supplements   ? ?Social Determinants of Health  ? ?Financial Resource Strain: Not on file  ?Food Insecurity: Not on file  ?Transportation Needs: Not on file  ?Physical Activity: Not on file  ?Stress: Not on file  ?Social Connections: Not on file  ?Intimate Partner Violence: Not on file  ? ? ?Review of Systems ?Per HPI.  ? ?Objective:  ? ?Vitals:  ? 08/20/21 0931  ?BP: (!) 142/86  ?Pulse: 60  ?Resp: 17  ?Temp: 98 ?F (36.7 ?C)  ?TempSrc: Temporal  ?SpO2: 98%  ?Weight: 186 lb 12.8 oz (84.7 kg)  ?Height: 5\' 11"  (1.803 m)  ? ?Physical Exam ?Vitals reviewed.  ?Constitutional:   ?   Appearance: He is well-developed.  ?HENT:  ?   Head: Normocephalic and atraumatic.  ?   Right Ear: External ear normal.  ?   Left Ear: External ear normal.  ?Eyes:  ?   Conjunctiva/sclera: Conjunctivae normal.  ?   Pupils: Pupils are equal, round, and reactive to light.  ?Neck:  ?   Thyroid: No thyromegaly.  ?   Vascular: No carotid bruit or JVD.  ?Cardiovascular:  ?   Rate and Rhythm: Normal rate and regular rhythm.  ?   Heart sounds: Normal heart sounds. No murmur heard. ?Pulmonary:  ?   Effort: Pulmonary effort is normal. No respiratory distress.  ?   Breath sounds: Normal breath sounds. No wheezing or rales.  ?Abdominal:  ?   General: There is  no distension.  ?   Palpations: Abdomen is soft.  ?   Tenderness: There is no abdominal tenderness.  ?Musculoskeletal:     ?   General: No tenderness. Normal range of motion.  ?   Cervical back: Normal range of motion and neck supple.  ?   Right lower leg: No edema.  ?   Left lower leg: No edema.  ?Lymphadenopathy:  ?   Cervical: No cervical adenopathy.  ?Skin: ?   General: Skin is warm  and dry.  ?Neurological:  ?   Mental Status: He is alert and oriented to person, place, and time.  ?   Deep Tendon Reflexes: Reflexes are normal and symmetric.  ?Psychiatric:     ?   Mood and Affect: Mood normal.     ?   Behavior: Behavior normal.  ? ? ? ? ? ?Assessment & Plan:  ?KUE FAMBROUGH is a 56 y.o. male . ?Annual physical exam ? - -anticipatory guidance as below in AVS, screening labs above. Health maintenance items as above in HPI discussed/recommended as applicable.  ? ?Screening for colon cancer - Plan: Cologuard ? ?Elevated TSH - Plan: TSH + free T4 ? -Asymptomatic, repeat TSH with free T4 to decide on continued monitoring versus potential medications. ? ?Hypertension, essential - Plan: losartan (COZAAR) 25 MG tablet ? -Decreased control on beta-blocker alone, add low-dose losartan.  Potential side effects discussed, labs pending ? ?Screening for prostate cancer - Plan: PSA ? ?Screening for hyperlipidemia - Plan: Lipid panel ? ?Screening for diabetes mellitus - Plan: Comprehensive metabolic panel ? ?Palpitations - Plan: CBC ? -Intermittent, check repeat TSH as above.  Check CBC.  Continue Toprol, follow-up with cardiology if persistent. ? ?Meds ordered this encounter  ?Medications  ? losartan (COZAAR) 25 MG tablet  ?  Sig: Take 1 tablet (25 mg total) by mouth daily.  ?  Dispense:  90 tablet  ?  Refill:  1  ? ?Patient Instructions  ?Add losartan low dose for blood pressure. Continue toprol.  ?I will recheck thyroid levels today to decide if meds or monitoring needed.  Follow-up in 6 months for blood pressure, repeat  labs.  Let me know if there are questions sooner.  Thanks for coming in today.  ? ? ?Preventive Care 31-69 Years Old, Male ?Preventive care refers to lifestyle choices and visits with your health care provi

## 2021-08-21 LAB — TSH+FREE T4: TSH W/REFLEX TO FT4: 1.6 mIU/L (ref 0.40–4.50)

## 2021-08-21 NOTE — Telephone Encounter (Signed)
Patient is calling in stating he knows his PSA was elevated. He got an appointment with [facility] on Monday, they are needing a copy of those results faxed to them. Fax number is [phone] put attention to Dr.Gay.  ?

## 2021-08-21 NOTE — Telephone Encounter (Signed)
Pt is requesting lab results when you have a moment to review. I can call pt on your behalf  ? ?

## 2021-08-24 ENCOUNTER — Other Ambulatory Visit (HOSPITAL_COMMUNITY): Payer: Self-pay

## 2021-08-24 DIAGNOSIS — R972 Elevated prostate specific antigen [PSA]: Secondary | ICD-10-CM | POA: Diagnosis not present

## 2021-08-24 DIAGNOSIS — N401 Enlarged prostate with lower urinary tract symptoms: Secondary | ICD-10-CM | POA: Diagnosis not present

## 2021-08-24 DIAGNOSIS — R350 Frequency of micturition: Secondary | ICD-10-CM | POA: Diagnosis not present

## 2021-08-24 MED ORDER — LEVOFLOXACIN 750 MG PO TABS
750.0000 mg | ORAL_TABLET | Freq: Every day | ORAL | 0 refills | Status: DC
  Filled 2021-08-24: qty 1, 1d supply, fill #0

## 2021-08-27 ENCOUNTER — Other Ambulatory Visit (HOSPITAL_COMMUNITY): Payer: Self-pay

## 2021-09-15 DIAGNOSIS — R972 Elevated prostate specific antigen [PSA]: Secondary | ICD-10-CM | POA: Diagnosis not present

## 2021-09-22 ENCOUNTER — Other Ambulatory Visit (HOSPITAL_COMMUNITY): Payer: Self-pay

## 2021-09-22 DIAGNOSIS — N401 Enlarged prostate with lower urinary tract symptoms: Secondary | ICD-10-CM | POA: Diagnosis not present

## 2021-09-22 DIAGNOSIS — R350 Frequency of micturition: Secondary | ICD-10-CM | POA: Diagnosis not present

## 2021-09-22 DIAGNOSIS — R972 Elevated prostate specific antigen [PSA]: Secondary | ICD-10-CM | POA: Diagnosis not present

## 2021-09-22 MED ORDER — TAMSULOSIN HCL 0.4 MG PO CAPS
0.4000 mg | ORAL_CAPSULE | Freq: Every evening | ORAL | 3 refills | Status: DC
  Filled 2021-09-22: qty 90, 90d supply, fill #0
  Filled 2021-12-21: qty 90, 90d supply, fill #1
  Filled 2022-03-14: qty 90, 90d supply, fill #2
  Filled 2022-06-09: qty 90, 90d supply, fill #3

## 2021-11-04 ENCOUNTER — Other Ambulatory Visit (HOSPITAL_COMMUNITY): Payer: Self-pay

## 2021-11-23 ENCOUNTER — Other Ambulatory Visit (HOSPITAL_COMMUNITY): Payer: Self-pay

## 2021-11-28 DIAGNOSIS — H524 Presbyopia: Secondary | ICD-10-CM | POA: Diagnosis not present

## 2021-12-21 ENCOUNTER — Other Ambulatory Visit (HOSPITAL_COMMUNITY): Payer: Self-pay

## 2022-02-10 ENCOUNTER — Other Ambulatory Visit (HOSPITAL_COMMUNITY): Payer: Self-pay

## 2022-02-10 ENCOUNTER — Other Ambulatory Visit: Payer: Self-pay | Admitting: Family Medicine

## 2022-02-10 DIAGNOSIS — I10 Essential (primary) hypertension: Secondary | ICD-10-CM

## 2022-02-10 MED ORDER — LOSARTAN POTASSIUM 25 MG PO TABS
25.0000 mg | ORAL_TABLET | Freq: Every day | ORAL | 1 refills | Status: DC
  Filled 2022-02-10: qty 90, 90d supply, fill #0

## 2022-02-11 DIAGNOSIS — H61002 Unspecified perichondritis of left external ear: Secondary | ICD-10-CM | POA: Diagnosis not present

## 2022-02-11 DIAGNOSIS — D485 Neoplasm of uncertain behavior of skin: Secondary | ICD-10-CM | POA: Diagnosis not present

## 2022-02-18 ENCOUNTER — Encounter: Payer: Self-pay | Admitting: Family Medicine

## 2022-02-18 ENCOUNTER — Ambulatory Visit: Payer: 59 | Admitting: Family Medicine

## 2022-02-18 VITALS — BP 142/92 | HR 62 | Temp 98.8°F | Ht 71.0 in | Wt 189.8 lb

## 2022-02-18 DIAGNOSIS — E785 Hyperlipidemia, unspecified: Secondary | ICD-10-CM | POA: Diagnosis not present

## 2022-02-18 DIAGNOSIS — I10 Essential (primary) hypertension: Secondary | ICD-10-CM | POA: Diagnosis not present

## 2022-02-18 NOTE — Patient Instructions (Addendum)
If BP is above 130/80 consistently - increase to '50mg'$  total losartan, and let me know if that is tolerated so I can send in new Rx. If side effects, return to '25mg'$ . Either way let me know what works best. No change in toprol for now. If any concerns on labs I will let you know.

## 2022-02-18 NOTE — Progress Notes (Signed)
Subjective:  Patient ID: Eugene Brown, male    DOB: 05/21/1966  Age: 56 y.o. MRN: [id number]  CC:  Chief Complaint  Patient presents with   Hypertension    Pt states all is well    HPI Eugene Brown presents for   Hypertension: Discussed at his visit to establish care in March.  Treated with Toprol-XL 25 mg daily at that time.  History of GERD, palpitations, hypertension.  Previously seen by cardiology, Dr. Clifton James.  Prior palpitations that have improved.  Echo was reassuring in January.  Elevated BP at last visit on monotherapy with beta-blocker, added losartan 25 mg daily. Home readings: 137/62. Known hx of white coat syndrome.  HR in 60's, no recent PVC"s. No orthostasis sx's, including with addition of tamsulosin.  BP Readings from Last 3 Encounters:  02/18/22 (!) 142/92  08/20/21 (!) 142/86  07/21/21 (!) 152/70   Lab Results  Component Value Date   CREATININE 1.23 08/20/2021   Hyperlipidemia: Mild elevation previously, plan for diet/exercise changes with recheck in 6 months.  Not fasting today but will have fasting lab visit done soon. Still exercising 3days per week . and walking   Wt Readings from Last 3 Encounters:  02/18/22 189 lb 12.8 oz (86.1 kg)  08/20/21 186 lb 12.8 oz (84.7 kg)  07/21/21 189 lb (85.7 kg)   Lab Results  Component Value Date   CHOL 220 (H) 08/20/2021   HDL 43.50 08/20/2021   LDLCALC 142 (H) 08/20/2021   TRIG 169.0 (H) 08/20/2021   CHOLHDL 5 08/20/2021   Lab Results  Component Value Date   ALT 53 08/20/2021   AST 34 08/20/2021   ALKPHOS 71 08/20/2021   BILITOT 0.6 08/20/2021    Elevated PSA, BPH 13.39 on 08/20/2021, repeat on April 3 10.3.  Followed by [facility], Dr. Cardell Peach.  BPH with LUTS.  Status post TRUS biopsy 09/15/2021 with BPH in all cores.  Elevated PSA thought to be due to enlarged prostate.  70-month recheck planned.  If rising, plan for prostate MRI.  Tamsulosin prescribed for BPH with LUTS.  November 2nd  follow-up planned.    Followed by dermatology - Donzetta Starch at [facility]. Biopsy last week benign.   History Patient Active Problem List   Diagnosis Date Noted   PREMATURE VENTRICULAR CONTRACTIONS 08/27/2008   HYPERTENSION, UNSPECIFIED 08/20/2008   GERD 08/20/2008   PALPITATIONS 08/02/2008   CHEST PAIN-UNSPECIFIED 08/02/2008   Past Medical History:  Diagnosis Date   GERD (gastroesophageal reflux disease)    HTN (hypertension)    Past Surgical History:  Procedure Laterality Date   CERVICAL SPINE SURGERY     No Known Allergies Prior to Admission medications   Medication Sig Start Date End Date Taking? Authorizing Provider  aspirin 81 MG tablet Take 81 mg by mouth daily.     Yes [provider]  losartan (COZAAR) 25 MG tablet Take 1 tablet (25 mg total) by mouth daily. 02/10/22  Yes Shade Flood, MD  metoprolol succinate (TOPROL XL) 25 MG 24 hr tablet Take 1 tablet (25 mg total) by mouth daily. 06/01/21  Yes Kathleene Hazel, MD  Multiple Vitamin (MULTIVITAMIN) tablet Take 1 tablet by mouth daily.     Yes [provider]  Omega-3 Fatty Acids (FISH OIL) 1200 MG CAPS Take 1 capsule by mouth 2 (two) times daily.     Yes [provider]  tamsulosin (FLOMAX) 0.4 MG CAPS capsule Take 1 capsule (0.4 mg total) by  mouth at bedtime. 09/22/21  Yes   levofloxacin (LEVAQUIN) 750 MG tablet Take 1 tablet (750 mg total) by mouth once, 1 hour prior to biopsy Patient not taking: Reported on 02/18/2022 08/24/21      Social History   Socioeconomic History   Marital status: Married    Spouse name: Not on file   Number of children: 2   Years of education: Not on file   Highest education level: Not on file  Occupational History   Occupation: Doctor, general practice at [employer]  Tobacco Use   Smoking status: Never   Smokeless tobacco: Never   Tobacco comments:    no tobacco   Substance and Sexual Activity   Alcohol use: No   Drug use: No   Sexual activity: Yes   Other Topics Concern   Not on file  Social History Narrative   Married, 2 children.    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    No significant caffeine use; no otc supplements    Social Determinants of Corporate investment banker Strain: Not on file  Food Insecurity: Not on file  Transportation Needs: Not on file  Physical Activity: Not on file  Stress: Not on file  Social Connections: Not on file  Intimate Partner Violence: Not on file    Review of Systems  Constitutional:  Negative for fatigue and unexpected weight change.  Eyes:  Negative for visual disturbance.  Respiratory:  Negative for cough, chest tightness and shortness of breath.   Cardiovascular:  Negative for chest pain, palpitations and leg swelling.  Gastrointestinal:  Negative for abdominal pain and blood in stool.  Neurological:  Negative for dizziness, light-headedness and headaches.     Objective:   Vitals:   02/18/22 1548 02/18/22 1614  BP: (!) 150/90 (!) 142/92  Pulse: 62   Temp: 98.8 F (37.1 C)   SpO2: 97%   Weight: 189 lb 12.8 oz (86.1 kg)   Height: 5\' 11"  (1.803 m)      Physical Exam Vitals reviewed.  Constitutional:      Appearance: He is well-developed.  HENT:     Head: Normocephalic and atraumatic.  Neck:     Vascular: No carotid bruit or JVD.  Cardiovascular:     Rate and Rhythm: Normal rate and regular rhythm.     Heart sounds: Normal heart sounds. No murmur heard. Pulmonary:     Effort: Pulmonary effort is normal.     Breath sounds: Normal breath sounds. No rales.  Musculoskeletal:     Right lower leg: No edema.     Left lower leg: No edema.  Skin:    General: Skin is warm and dry.  Neurological:     Mental Status: He is alert and oriented to person, place, and time.  Psychiatric:        Mood and Affect: Mood normal.        Assessment & Plan:  Eugene Brown is a 56 y.o. male . Hypertension, essential - Plan: Comprehensive metabolic panel, CANCELED:  Comprehensive metabolic panel  -Tolerating losartan, Toprol.  Whitecoat hypertension component with elevated readings in office, lower at home but still systolic in the 130s, stage I hypertension.  Option of 50 mg losartan dosing with doubling up on his 25's for now, and if tolerated I can send in a new prescription.  He will let me know by MyChart.  Lab visit planned next week.  Continue same dose of Toprol for now.  Hyperlipidemia,  unspecified hyperlipidemia type - Plan: Comprehensive metabolic panel, Lipid panel, CANCELED: Lipid panel, CANCELED: Comprehensive metabolic panel  -Borderline previously, check fasting labs next week, ASCVD risk going to decide on statin at that time.  Keep follow-up with urology as planned for repeat PSA, history of BPH, now on tamsulosin.  No orders of the defined types were placed in this encounter.  Patient Instructions  If BP is above 130/80 consistently - increase to 50mg  total losartan, and let me know if that is tolerated so I can send in new Rx. If side effects, return to 25mg . Either way let me know what works best. No change in toprol for now. If any concerns on labs I will let you know.        Signed,   Meredith Staggers, MD [facility], [facility] 02/18/22 5:07 PM

## 2022-02-19 ENCOUNTER — Other Ambulatory Visit (HOSPITAL_COMMUNITY): Payer: Self-pay

## 2022-02-22 ENCOUNTER — Other Ambulatory Visit (INDEPENDENT_AMBULATORY_CARE_PROVIDER_SITE_OTHER): Payer: 59

## 2022-02-22 DIAGNOSIS — I10 Essential (primary) hypertension: Secondary | ICD-10-CM

## 2022-02-22 DIAGNOSIS — E785 Hyperlipidemia, unspecified: Secondary | ICD-10-CM

## 2022-02-22 LAB — COMPREHENSIVE METABOLIC PANEL
ALT: 48 U/L (ref 0–53)
AST: 30 U/L (ref 0–37)
Albumin: 5 g/dL (ref 3.5–5.2)
Alkaline Phosphatase: 64 U/L (ref 39–117)
BUN: 18 mg/dL (ref 6–23)
CO2: 27 mEq/L (ref 19–32)
Calcium: 9.9 mg/dL (ref 8.4–10.5)
Chloride: 103 mEq/L (ref 96–112)
Creatinine, Ser: 1.23 mg/dL (ref 0.40–1.50)
GFR: 64.65 mL/min (ref >60.00)
Glucose, Bld: 100 mg/dL — ABNORMAL HIGH (ref 70–99)
Potassium: 4.2 mEq/L (ref 3.5–5.1)
Sodium: 139 mEq/L (ref 135–145)
Total Bilirubin: 0.7 mg/dL (ref 0.2–1.2)
Total Protein: 7.2 g/dL (ref 6.0–8.3)

## 2022-02-22 LAB — LIPID PANEL
Cholesterol: 193 mg/dL (ref 0–200)
HDL: 40.4 mg/dL
LDL Cholesterol: 114 mg/dL — ABNORMAL HIGH (ref 0–99)
NonHDL: 152.3
Total CHOL/HDL Ratio: 5
Triglycerides: 193 mg/dL — ABNORMAL HIGH (ref 0.0–149.0)
VLDL: 38.6 mg/dL (ref 0.0–40.0)

## 2022-03-02 ENCOUNTER — Other Ambulatory Visit: Payer: Self-pay

## 2022-03-02 ENCOUNTER — Encounter: Payer: Self-pay | Admitting: Family Medicine

## 2022-03-02 DIAGNOSIS — I10 Essential (primary) hypertension: Secondary | ICD-10-CM

## 2022-03-02 MED ORDER — LOSARTAN POTASSIUM 50 MG PO TABS: 50.0000 mg | ORAL_TABLET | Freq: Every day | ORAL | 1 refills | Status: DC

## 2022-03-08 ENCOUNTER — Other Ambulatory Visit (HOSPITAL_COMMUNITY): Payer: Self-pay

## 2022-03-08 MED ORDER — LOSARTAN POTASSIUM 50 MG PO TABS
50.0000 mg | ORAL_TABLET | Freq: Every day | ORAL | 1 refills | Status: DC
  Filled 2022-03-08: qty 90, 90d supply, fill #0
  Filled 2022-06-09: qty 90, 90d supply, fill #1

## 2022-03-15 ENCOUNTER — Other Ambulatory Visit (HOSPITAL_COMMUNITY): Payer: Self-pay

## 2022-03-25 DIAGNOSIS — N401 Enlarged prostate with lower urinary tract symptoms: Secondary | ICD-10-CM | POA: Diagnosis not present

## 2022-03-25 DIAGNOSIS — R350 Frequency of micturition: Secondary | ICD-10-CM | POA: Diagnosis not present

## 2022-04-01 DIAGNOSIS — R972 Elevated prostate specific antigen [PSA]: Secondary | ICD-10-CM | POA: Diagnosis not present

## 2022-04-01 DIAGNOSIS — R350 Frequency of micturition: Secondary | ICD-10-CM | POA: Diagnosis not present

## 2022-04-01 DIAGNOSIS — N401 Enlarged prostate with lower urinary tract symptoms: Secondary | ICD-10-CM | POA: Diagnosis not present

## 2022-05-18 ENCOUNTER — Other Ambulatory Visit: Payer: Self-pay | Admitting: Cardiovascular Disease

## 2022-05-18 ENCOUNTER — Other Ambulatory Visit (HOSPITAL_COMMUNITY): Payer: Self-pay

## 2022-05-18 MED ORDER — METOPROLOL SUCCINATE ER 25 MG PO TB24
25.0000 mg | ORAL_TABLET | Freq: Every day | ORAL | 0 refills | Status: DC
  Filled 2022-05-18: qty 90, 90d supply, fill #0

## 2022-08-14 ENCOUNTER — Other Ambulatory Visit: Payer: Self-pay | Admitting: Cardiovascular Disease

## 2022-08-16 ENCOUNTER — Other Ambulatory Visit (HOSPITAL_COMMUNITY): Payer: Self-pay

## 2022-08-16 MED ORDER — METOPROLOL SUCCINATE ER 25 MG PO TB24
25.0000 mg | ORAL_TABLET | Freq: Every day | ORAL | 0 refills | Status: DC
  Filled 2022-08-16: qty 30, 30d supply, fill #0

## 2022-08-19 ENCOUNTER — Ambulatory Visit: Payer: 59 | Admitting: Family Medicine

## 2022-08-26 ENCOUNTER — Encounter: Payer: Self-pay | Admitting: Family Medicine

## 2022-08-26 ENCOUNTER — Other Ambulatory Visit (HOSPITAL_COMMUNITY): Payer: Self-pay

## 2022-08-26 ENCOUNTER — Ambulatory Visit: Payer: Commercial Managed Care - PPO | Admitting: Family Medicine

## 2022-08-26 VITALS — BP 122/74 | HR 58 | Temp 98.8°F | Ht 71.0 in | Wt 189.4 lb

## 2022-08-26 DIAGNOSIS — R002 Palpitations: Secondary | ICD-10-CM | POA: Diagnosis not present

## 2022-08-26 DIAGNOSIS — R7989 Other specified abnormal findings of blood chemistry: Secondary | ICD-10-CM

## 2022-08-26 DIAGNOSIS — I10 Essential (primary) hypertension: Secondary | ICD-10-CM

## 2022-08-26 DIAGNOSIS — E785 Hyperlipidemia, unspecified: Secondary | ICD-10-CM

## 2022-08-26 MED ORDER — LOSARTAN POTASSIUM 50 MG PO TABS
50.0000 mg | ORAL_TABLET | Freq: Every day | ORAL | 3 refills | Status: DC
Start: 2022-08-26 — End: 2023-08-23
  Filled 2022-08-26: qty 90, 90d supply, fill #0
  Filled 2022-12-02: qty 90, 90d supply, fill #1
  Filled 2023-03-02: qty 90, 90d supply, fill #2
  Filled 2023-05-27: qty 90, 90d supply, fill #3

## 2022-08-26 NOTE — Progress Notes (Signed)
Subjective:  Patient ID: Eugene Brown, male    DOB: Oct 10, 1965  Age: 57 y.o. MRN: [id number]  CC:  Chief Complaint  Patient presents with   Hypertension    Pt states that's he's been doing well BP have been doing well. Pt checked BP three separate times today between 4am and right before visit 127/74, 119/74, 122/79    HPI Eugene Brown presents for   Hypertension: Losartan 50 mg daily, Toprol-XL 25 mg daily.  Previous treatment by urology for elevated PSA, BPH. PSA has been decreasing. Negative prostate bx, possible MRI.  MyChart message in October, higher dose losartan at 50 mg from 25 at that time. Elevated tsh in 06/2021 - 5.58, normal on repeat.  No palpitations/pvcs. Initial fatigue with betablocker has resolved.  Exercising without issue. No DOE.  Home readings: 127/74, 119/74, 122/79.  BP Readings from Last 3 Encounters:  08/26/22 122/74  02/18/22 (!) 142/92  08/20/21 (!) 142/86   Lab Results  Component Value Date   CREATININE 1.23 02/22/2022    Hyperlipidemia: No statin, exercising more, improving diet.  The 10-year ASCVD risk score (Arnett DK, et al., 2019) is: 9.6%   Values used to calculate the score:     Age: 6 years     Sex: Male     Is Non-Hispanic African American: No     Diabetic: No     Tobacco smoker: No     Systolic Blood Pressure: 122 mmHg     Is BP treated: Yes     HDL Cholesterol: 40.4 mg/dL     Total Cholesterol: 193 mg/dL  Lab Results  Component Value Date   CHOL 193 02/22/2022   HDL 40.40 02/22/2022   LDLCALC 114 (H) 02/22/2022   TRIG 193.0 (H) 02/22/2022   CHOLHDL 5 02/22/2022   Lab Results  Component Value Date   ALT 48 02/22/2022   AST 30 02/22/2022   ALKPHOS 64 02/22/2022   BILITOT 0.7 02/22/2022   Wt Readings from Last 3 Encounters:  08/26/22 189 lb 6.4 oz (85.9 kg)  02/18/22 189 lb 12.8 oz (86.1 kg)  08/20/21 186 lb 12.8 oz (84.7 kg)       History Patient Active Problem List   Diagnosis Date Noted    PREMATURE VENTRICULAR CONTRACTIONS 08/27/2008   HYPERTENSION, UNSPECIFIED 08/20/2008   GERD 08/20/2008   PALPITATIONS 08/02/2008   CHEST PAIN-UNSPECIFIED 08/02/2008   Past Medical History:  Diagnosis Date   GERD (gastroesophageal reflux disease)    HTN (hypertension)    Past Surgical History:  Procedure Laterality Date   CERVICAL SPINE SURGERY     No Known Allergies Prior to Admission medications   Medication Sig Start Date End Date Taking? Authorizing Provider  aspirin 81 MG tablet Take 81 mg by mouth daily.     Yes [provider]  losartan (COZAAR) 50 MG tablet Take 1 tablet (50 mg total) by mouth daily. 03/02/22  Yes Shade Flood, MD  metoprolol succinate (TOPROL XL) 25 MG 24 hr tablet Take 1 tablet (25 mg total) by mouth daily. 08/16/22  Yes Wendall Stade, MD  Multiple Vitamin (MULTIVITAMIN) tablet Take 1 tablet by mouth daily.     Yes [provider]  Omega-3 Fatty Acids (FISH OIL) 1200 MG CAPS Take 1 capsule by mouth 2 (two) times daily.     Yes [provider]  tamsulosin (FLOMAX) 0.4 MG CAPS capsule Take 1 capsule (0.4 mg total) by mouth at bedtime. 09/22/21  Yes  levofloxacin (LEVAQUIN) 750 MG tablet Take 1 tablet (750 mg total) by mouth once, 1 hour prior to biopsy Patient not taking: Reported on 02/18/2022 08/24/21      Social History   Socioeconomic History   Marital status: Married    Spouse name: Not on file   Number of children: 2   Years of education: Not on file   Highest education level: Master's degree (e.g., MA, MS, MEng, MEd, MSW, MBA)  Occupational History   Occupation: Doctor, general practice at [employer]  Tobacco Use   Smoking status: Never   Smokeless tobacco: Never   Tobacco comments:    no tobacco   Substance and Sexual Activity   Alcohol use: No   Drug use: No   Sexual activity: Yes  Other Topics Concern   Not on file  Social History Narrative   Married, 2 children.    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     No significant caffeine use; no otc supplements    Social Determinants of Health   Financial Resource Strain: Low Risk  (08/22/2022)   Overall Financial Resource Strain (CARDIA)    Difficulty of Paying Living Expenses: Not very hard  Food Insecurity: No Food Insecurity (08/22/2022)   Hunger Vital Sign    Worried About Running Out of Food in the Last Year: Never true    Ran Out of Food in the Last Year: Never true  Transportation Needs: No Transportation Needs (08/22/2022)   PRAPARE - Administrator, Civil Service (Medical): No    Lack of Transportation (Non-Medical): No  Physical Activity: Insufficiently Active (08/22/2022)   Exercise Vital Sign    Days of Exercise per Week: 1 day    Minutes of Exercise per Session: 10 min  Stress: No Stress Concern Present (08/22/2022)   [employer] of Occupational Health - Occupational Stress Questionnaire    Feeling of Stress : Only a little  Social Connections: Socially Integrated (08/22/2022)   Social Connection and Isolation Panel [NHANES]    Frequency of Communication with Friends and Family: Three times a week    Frequency of Social Gatherings with Friends and Family: Once a week    Attends Religious Services: More than 4 times per year    Active Member of [employer] or Organizations: Yes    Attends Banker Meetings: 1 to 4 times per year    Marital Status: Married  Catering manager Violence: Not on file    Review of Systems  Constitutional:  Negative for fatigue and unexpected weight change.  Eyes:  Negative for visual disturbance.  Respiratory:  Negative for cough, chest tightness and shortness of breath.   Cardiovascular:  Negative for chest pain, palpitations and leg swelling.  Gastrointestinal:  Negative for abdominal pain and blood in stool.  Neurological:  Negative for dizziness, light-headedness and headaches.     Objective:   Vitals:   08/26/22 1532 08/26/22 1601  BP: (!) 152/78 122/74  Pulse: (!) 58    Temp: 98.8 F (37.1 C)   TempSrc: Temporal   SpO2: 95%   Weight: 189 lb 6.4 oz (85.9 kg)   Height: 5\' 11"  (1.803 m)      Physical Exam Vitals reviewed.  Constitutional:      Appearance: He is well-developed.  HENT:     Head: Normocephalic and atraumatic.  Neck:     Vascular: No carotid bruit or JVD.  Cardiovascular:     Rate and Rhythm: Normal rate and  regular rhythm.     Heart sounds: Normal heart sounds. No murmur heard. Pulmonary:     Effort: Pulmonary effort is normal.     Breath sounds: Normal breath sounds. No rales.  Musculoskeletal:     Right lower leg: No edema.     Left lower leg: No edema.  Skin:    General: Skin is warm and dry.  Neurological:     Mental Status: He is alert and oriented to person, place, and time.  Psychiatric:        Mood and Affect: Mood normal.        Assessment & Plan:  Eugene Brown is a 57 y.o. male . Hypertension, essential - Plan: losartan (COZAAR) 50 MG tablet  -Stable with Toprol, losartan current doses.  Initial fatigue resolved.  Exercising without symptoms.  Continue same regimen.  Working on [employer], exercise, deferred labs for 3 additional months lab only visit that time with in person visit in 6 months.  Palpitations  -Stable, continue Toprol.  Asymptomatic bradycardia.  Elevated TSH - Plan: TSH  -Prior borderline elevated TSH, normalized on recheck, repeat with upcoming labs.  Hyperlipidemia, unspecified hyperlipidemia type - Plan: Comprehensive metabolic panel, Lipid panel  -Borderline ASCVD risk score based on last labs.  As above he has improved diet, exercising and continued improvement.  Will defer repeat labs for an additional 3 months to see effect of continued exercise, dietary changes.  Depending on those results and ASCVD risk score, discussed possibility of coronary calcium scoring to help decide on statin.  No new meds for now.   Meds ordered this encounter  Medications   losartan (COZAAR)  50 MG tablet    Sig: Take 1 tablet (50 mg total) by mouth daily.    Dispense:  90 tablet    Refill:  3   Patient Instructions  Blood pressure looks good on recheck today, no med changes for now.  Continue to watch diet, exercise and plan on lab only visit in 3 months.  Depending on cholesterol levels and 10-year ASCVD risk score at that time we could consider coronary calcium scoring to decide on statin medication.  Current levels are borderline to recommend a statin.  53-month follow-up with me but please let me know if there are questions in the meantime.  Take care!    Signed,   Meredith Staggers, MD [facility], [facility] 08/26/22 4:32 PM

## 2022-08-26 NOTE — Patient Instructions (Signed)
Blood pressure looks good on recheck today, no med changes for now.  Continue to watch diet, exercise and plan on lab only visit in 3 months.  Depending on cholesterol levels and 10-year ASCVD risk score at that time we could consider coronary calcium scoring to decide on statin medication.  Current levels are borderline to recommend a statin.  75-month follow-up with me but please let me know if there are questions in the meantime.  Take care!

## 2022-09-10 ENCOUNTER — Other Ambulatory Visit (HOSPITAL_COMMUNITY): Payer: Self-pay

## 2022-09-10 ENCOUNTER — Other Ambulatory Visit: Payer: Self-pay | Admitting: Cardiovascular Disease

## 2022-09-10 MED ORDER — METOPROLOL SUCCINATE ER 25 MG PO TB24
25.0000 mg | ORAL_TABLET | Freq: Every day | ORAL | 0 refills | Status: DC
  Filled 2022-09-10: qty 15, 15d supply, fill #0

## 2022-09-13 ENCOUNTER — Other Ambulatory Visit (HOSPITAL_COMMUNITY): Payer: Self-pay

## 2022-09-14 ENCOUNTER — Other Ambulatory Visit (HOSPITAL_COMMUNITY): Payer: Self-pay

## 2022-09-14 MED ORDER — TAMSULOSIN HCL 0.4 MG PO CAPS
0.4000 mg | ORAL_CAPSULE | Freq: Every day | ORAL | 3 refills | Status: DC
  Filled 2022-09-14: qty 90, 90d supply, fill #0
  Filled 2022-12-04: qty 90, 90d supply, fill #1

## 2022-09-24 ENCOUNTER — Other Ambulatory Visit (HOSPITAL_COMMUNITY): Payer: Self-pay

## 2022-09-24 ENCOUNTER — Other Ambulatory Visit: Payer: Self-pay | Admitting: Cardiovascular Disease

## 2022-09-24 MED ORDER — METOPROLOL SUCCINATE ER 25 MG PO TB24
25.0000 mg | ORAL_TABLET | Freq: Every day | ORAL | 0 refills | Status: DC
  Filled 2022-09-24: qty 15, 15d supply, fill #0

## 2022-09-30 DIAGNOSIS — R350 Frequency of micturition: Secondary | ICD-10-CM | POA: Diagnosis not present

## 2022-09-30 DIAGNOSIS — N401 Enlarged prostate with lower urinary tract symptoms: Secondary | ICD-10-CM | POA: Diagnosis not present

## 2022-10-07 DIAGNOSIS — N401 Enlarged prostate with lower urinary tract symptoms: Secondary | ICD-10-CM | POA: Diagnosis not present

## 2022-10-07 DIAGNOSIS — R350 Frequency of micturition: Secondary | ICD-10-CM | POA: Diagnosis not present

## 2022-10-07 DIAGNOSIS — R972 Elevated prostate specific antigen [PSA]: Secondary | ICD-10-CM | POA: Diagnosis not present

## 2022-10-08 ENCOUNTER — Other Ambulatory Visit: Payer: Self-pay | Admitting: Urology

## 2022-10-08 DIAGNOSIS — R972 Elevated prostate specific antigen [PSA]: Secondary | ICD-10-CM

## 2022-10-20 ENCOUNTER — Other Ambulatory Visit: Payer: Self-pay | Admitting: Cardiovascular Disease

## 2022-10-20 ENCOUNTER — Other Ambulatory Visit (HOSPITAL_COMMUNITY): Payer: Self-pay

## 2022-10-20 ENCOUNTER — Telehealth: Payer: Self-pay | Admitting: Cardiovascular Disease

## 2022-10-20 MED ORDER — METOPROLOL SUCCINATE ER 25 MG PO TB24
25.0000 mg | ORAL_TABLET | Freq: Every day | ORAL | 0 refills | Status: DC
  Filled 2022-10-20: qty 90, 90d supply, fill #0

## 2022-10-20 NOTE — Telephone Encounter (Signed)
*  STAT* If patient is at the pharmacy, call can be transferred to refill team.   1. Which medications need to be refilled? (please list name of each medication and dose if known)   metoprolol succinate (TOPROL XL) 25 MG 24 hr tablet    2. Which pharmacy/location (including street and city if local pharmacy) is medication to be sent to?  [facility] [place]    3. Do they need a 30 day or 90 day supply? 90   Pt is scheduled for 11/08/22 with D. Dunn , pt is completely out

## 2022-10-20 NOTE — Telephone Encounter (Signed)
Pt's medication was sent to pt's pharmacy as requested. Confirmation received.  °

## 2022-11-04 NOTE — Progress Notes (Signed)
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 Dr. Gibson Ramp note alludes to remote echo and stress test in 2010 that were normal long with cardiac monitor in 2010 showing PVCs/PACs. Last echo 05/2021 showed EF 60-65%, borderline dilation of aortic root and mild dilation of ascending aorta. Monitor 05/2021 showed NSR with rare PACs and PVCs (<1%), ectopic atrial rhythm present, avg 69bpm, range 45-119bpm (slowest HR during sleeping hours).  He is seen for follow-up doing well without any cardiac complaints. PVCs are well controlled on metoprolol. His BP is running high today.  He had a bit of a stressful morning at work. He states he has h/o BP anytime he comes to the doctor's office and that BP first thing in the morning at home tend to run completely normal, ie 117 systolic this morning. Initial BP 160/90, recheck later in visit 138/78. His parents are in their 90s. His mother had CABG. We discussed calcium scoring which he states Dr. Neva Seat had mentioned before.    Labwork independently reviewed: 02/2022 K 4.2, LFTs wnl, glu 100, Trig 193, LDL 114 07/2021 CBC wnl, TSH wnl   Past History   Past Medical History:  Diagnosis Date   GERD (gastroesophageal reflux disease)    HTN (hypertension)    Mild dilation of ascending aorta (HCC)    Premature atrial contractions    PVC's (premature ventricular contractions)     Past Surgical History:  Procedure Laterality Date   CERVICAL SPINE SURGERY      Current Medications: Current Meds  Medication Sig   aspirin 81 MG tablet Take 81 mg by mouth daily.      losartan (COZAAR) 50 MG tablet Take 1 tablet (50 mg total) by mouth daily.   metoprolol succinate (TOPROL XL) 25 MG 24 hr tablet Take 1 tablet (25 mg total) by mouth daily.   Multiple Vitamin (MULTIVITAMIN) tablet Take 1 tablet by mouth daily.     Omega-3 Fatty Acids (FISH OIL) 1200 MG CAPS Take 1 capsule by mouth 2 (two) times daily.     tamsulosin (FLOMAX) 0.4 MG CAPS capsule Take 1 capsule (0.4 mg total) by mouth daily at bedtime.      Allergies:   Patient has no known allergies.   Social History   Socioeconomic History   Marital status: Married    Spouse name: Not on file   Number of children: 2   Years of education: Not on file   Highest education level: Master's degree (e.g., MA, MS, MEng, MEd, MSW, MBA)  Occupational History   Occupation: Doctor, general practice at [employer]  Tobacco Use   Smoking status: Never   Smokeless tobacco: Never   Tobacco comments:    no tobacco   Substance and Sexual Activity   Alcohol use: No   Drug use: No   Sexual activity: Yes  Other Topics Concern   Not on file  Social History Narrative   Married, 2 children.    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    No significant caffeine use; no otc supplements    Social  Determinants of Health   Financial Resource Strain: Low Risk  (08/22/2022)   Overall Financial Resource Strain (CARDIA)    Difficulty of Paying Living Expenses: Not very hard  Food Insecurity: No Food Insecurity (08/22/2022)   Hunger Vital Sign    Worried About Running Out of Food in the Last Year: Never true    Ran Out of Food in the Last Year: Never true  Transportation Needs: No Transportation Needs (08/22/2022)   PRAPARE - Administrator, Civil Service (Medical): No    Lack of Transportation (Non-Medical): No  Physical Activity: Insufficiently Active (08/22/2022)   Exercise Vital Sign    Days of Exercise per Week: 1 day    Minutes of Exercise per Session: 10 min  Stress: No Stress Concern Present (08/22/2022)    [employer] of Occupational Health - Occupational Stress Questionnaire    Feeling of Stress : Only a little  Social Connections: Socially Integrated (08/22/2022)   Social Connection and Isolation Panel [NHANES]    Frequency of Communication with Friends and Family: Three times a week    Frequency of Social Gatherings with Friends and Family: Once a week    Attends Religious Services: More than 4 times per year    Active Member of [employer] or Organizations: Yes    Attends Banker Meetings: 1 to 4 times per year    Marital Status: Married     Family History:  The patient's family history includes Atrial fibrillation in his father; CAD in his mother.  ROS:   Please see the history of present illness.  All other systems are reviewed and otherwise negative.    EKG(s)/Additional Testing   EKG:  EKG is ordered today, personally reviewed, demonstrating SB 56bpm, nonspecific STTW changes in III similar to prior otherwise acute STT changes  CV Studies: Cardiac studies reviewed are outlined and summarized above. Otherwise please see EMR for full report.  Recent Labs: 02/22/2022: ALT 48; BUN 18; Creatinine, Ser 1.23; Potassium 4.2; Sodium 139  Recent Lipid Panel    Component Value Date/Time   CHOL 193 02/22/2022 0808   TRIG 193.0 (H) 02/22/2022 0808   HDL 40.40 02/22/2022 0808   CHOLHDL 5 02/22/2022 0808   VLDL 38.6 02/22/2022 0808   LDLCALC 114 (H) 02/22/2022 0808    PHYSICAL EXAM:    VS:  BP (!) 160/90   Pulse (!) 56   Ht 5\' 11"  (1.803 m)   Wt 190 lb (86.2 kg)   SpO2 98%   BMI 26.50 kg/m   BMI: Body mass index is 26.5 kg/m.  GEN: Well nourished, well developed male in no acute distress HEENT: normocephalic, atraumatic Neck: no JVD, carotid bruits, or masses Cardiac: RRR; no murmurs, rubs, or gallops, no edema  Respiratory:  clear to auscultation bilaterally, normal work of breathing GI: soft, nontender, nondistended, + BS MS: no deformity or atrophy Skin:  warm and dry, no rash Neuro:  Alert and Oriented x 3, Strength and sensation are intact, follows commands Psych: euthymic mood, full affect  Wt Readings from Last 3 Encounters:  11/08/22 190 lb (86.2 kg)  08/26/22 189 lb 6.4 oz (85.9 kg)  02/18/22 189 lb 12.8 oz (86.1 kg)     ASSESSMENT & PLAN:   1. PACs/PVCs - quiescent on metoprolol, OK to refill. EKG today shows stable sinus bradycardia in the 50s and he is asymptomatic.   2. Essential HTN - Suboptimal blood pressure control noted today. He reports a tendency for this  to run high in the office but is usually normal first thing in the morning at home. I think it would be helpful to have an idea of what his PM BP readings are running in case he's reached a point at which losartan would need to be titrated. The patient was provided instructions on monitoring blood pressure at home for 1 week and relaying results to our office. In case of need for BP med titration, will update metabolic panel and TSH today.  3. Mild dilation of aorta - seen by echo 05/2021. Recheck surveillance echocardiogram.   4. Mild hyperlipidemia, family history of coronary arteriosclerosis - discussed calcium score to help guide rx. He would like to pursue. We'll also update CMET/lipid profile with direct LDL today. I will send Dr. Neva Seat a message to let him know we are pursuing this since the patient had labs pending for July. If CAC is present, suggest statin for goal LDL <70.     Disposition: F/u with Dr. Clifton James in 1 year. Also has follow-up with PCP Dr. Neva Seat in 03/2023.  Medication Adjustments/Labs and Tests Ordered: Current medicines are reviewed at length with the patient today.  Concerns regarding medicines are outlined above. Medication changes, Labs and Tests ordered today are summarized above and listed in the Patient Instructions accessible in Encounters.   Signed, Laurann Montana, PA-C  11/08/2022 9:19 AM    [facility] Phone: [phone];  Fax: [phone]

## 2022-11-08 ENCOUNTER — Other Ambulatory Visit (HOSPITAL_COMMUNITY): Payer: Self-pay

## 2022-11-08 ENCOUNTER — Ambulatory Visit: Payer: Commercial Managed Care - PPO | Attending: Physician Assistant | Admitting: Physician Assistant

## 2022-11-08 ENCOUNTER — Encounter: Payer: Self-pay | Admitting: Physician Assistant

## 2022-11-08 VITALS — BP 138/78 | HR 56 | Ht 71.0 in | Wt 190.0 lb

## 2022-11-08 DIAGNOSIS — I493 Ventricular premature depolarization: Secondary | ICD-10-CM | POA: Diagnosis not present

## 2022-11-08 DIAGNOSIS — I77819 Aortic ectasia, unspecified site: Secondary | ICD-10-CM | POA: Diagnosis not present

## 2022-11-08 DIAGNOSIS — Z8249 Family history of ischemic heart disease and other diseases of the circulatory system: Secondary | ICD-10-CM

## 2022-11-08 DIAGNOSIS — I491 Atrial premature depolarization: Secondary | ICD-10-CM | POA: Diagnosis not present

## 2022-11-08 DIAGNOSIS — E785 Hyperlipidemia, unspecified: Secondary | ICD-10-CM | POA: Diagnosis not present

## 2022-11-08 LAB — CBC

## 2022-11-08 LAB — COMPREHENSIVE METABOLIC PANEL
AST: 24 IU/L (ref 0–40)
Albumin: 4.9 g/dL (ref 3.8–4.9)
Alkaline Phosphatase: 71 IU/L (ref 44–121)
Bilirubin Total: 0.4 mg/dL (ref 0.0–1.2)
Chloride: 105 mmol/L (ref 96–106)
Glucose: 92 mg/dL (ref 70–99)
Potassium: 4.2 mmol/L (ref 3.5–5.2)

## 2022-11-08 LAB — LIPID PANEL
HDL: 38 mg/dL — ABNORMAL LOW (ref >39)
VLDL Cholesterol Cal: 50 mg/dL — ABNORMAL HIGH (ref 5–40)

## 2022-11-08 LAB — LDL CHOLESTEROL, DIRECT: LDL Direct: 160 mg/dL — ABNORMAL HIGH (ref 0–99)

## 2022-11-08 MED ORDER — METOPROLOL SUCCINATE ER 25 MG PO TB24
25.0000 mg | ORAL_TABLET | Freq: Every day | ORAL | 0 refills | Status: DC
  Filled 2022-11-08 – 2023-01-14 (×2): qty 90, 90d supply, fill #0

## 2022-11-08 NOTE — Patient Instructions (Signed)
Medication Instructions:  Your physician recommends that you continue on your current medications as directed. Please refer to the Current Medication list given to you today.  *If you need a refill on your cardiac medications before your next appointment, please call your pharmacy*   Lab Work: CMET, Lipids, LDL Direct, TSH, CBC If you have labs (blood work) drawn today and your tests are completely normal, you will receive your results only by: MyChart Message (if you have MyChart) OR A paper copy in the mail If you have any lab test that is abnormal or we need to change your treatment, we will call you to review the results.   Testing/Procedures: ECHO Your physician has requested that you have an echocardiogram. Echocardiography is a painless test that uses sound waves to create images of your heart. It provides your doctor with information about the size and shape of your heart and how well your heart's chambers and valves are working. This procedure takes approximately one hour. There are no restrictions for this procedure. Please do NOT wear cologne, perfume, aftershave, or lotions (deodorant is allowed). Please arrive 15 minutes prior to your appointment time.   Your physician has requested that you have a calcium score CT scan. There is a $99 fee for the scan.     Follow-Up: At [facility], you and your health needs are our priority.  As part of our continuing mission to provide you with exceptional heart care, we have created designated Provider Care Teams.  These Care Teams include your primary Cardiologist (physician) and Advanced Practice Providers (APPs -  Physician Assistants and Nurse Practitioners) who all work together to provide you with the care you need, when you need it.  We recommend signing up for the patient portal called "MyChart".  Sign up information is provided on this After Visit Summary.  MyChart is used to connect with patients for Virtual Visits  (Telemedicine).  Patients are able to view lab/test results, encounter notes, upcoming appointments, etc.  Non-urgent messages can be sent to your provider as well.   To learn more about what you can do with MyChart, go to [email or web address].    Your next appointment:   1 year(s)  Provider:   Verne Carrow, MD     Other Instructions Blood Pressure checks  We need to get a better idea of what your blood pressure is running at home in the afternoon/evenings. Here are some instructions to follow: - I would recommend using a blood pressure cuff that goes on your arm. The wrist ones can be inaccurate. If you're purchasing one for the first time, try to select one that also reports your heart rate because this can be helpful information as well. - To check your blood pressure, choose a time at least 3 hours after taking your blood pressure medicines. Remain seated in a chair for 5 minutes quietly beforehand, then check it.  - Please record a list of those readings and call us/send in MyChart message in 1 week.

## 2022-11-09 ENCOUNTER — Other Ambulatory Visit (HOSPITAL_COMMUNITY): Payer: Self-pay

## 2022-11-09 DIAGNOSIS — E785 Hyperlipidemia, unspecified: Secondary | ICD-10-CM

## 2022-11-09 DIAGNOSIS — Z8249 Family history of ischemic heart disease and other diseases of the circulatory system: Secondary | ICD-10-CM

## 2022-11-09 LAB — CBC
Hematocrit: 47.3 % (ref 37.5–51.0)
Hemoglobin: 16.3 g/dL (ref 13.0–17.7)
MCH: 29.9 pg (ref 26.6–33.0)
MCV: 87 fL (ref 79–97)
Platelets: 202 10*3/uL (ref 150–450)
RBC: 5.46 x10E6/uL (ref 4.14–5.80)
WBC: 4.8 10*3/uL (ref 3.4–10.8)

## 2022-11-09 LAB — COMPREHENSIVE METABOLIC PANEL
ALT: 44 IU/L (ref 0–44)
BUN/Creatinine Ratio: 17 (ref 9–20)
BUN: 20 mg/dL (ref 6–24)
CO2: 21 mmol/L (ref 20–29)
Calcium: 9.7 mg/dL (ref 8.7–10.2)
Creatinine, Ser: 1.19 mg/dL (ref 0.76–1.27)
Globulin, Total: 1.8 g/dL (ref 1.5–4.5)
Sodium: 140 mmol/L (ref 134–144)
Total Protein: 6.7 g/dL (ref 6.0–8.5)
eGFR: 70 mL/min/{1.73_m2} (ref >59)

## 2022-11-09 LAB — LIPID PANEL
Chol/HDL Ratio: 6.2 ratio — ABNORMAL HIGH (ref 0.0–5.0)
Cholesterol, Total: 234 mg/dL — ABNORMAL HIGH (ref 100–199)
LDL Chol Calc (NIH): 146 mg/dL — ABNORMAL HIGH (ref 0–99)
Triglycerides: 275 mg/dL — ABNORMAL HIGH (ref 0–149)

## 2022-11-09 LAB — TSH: TSH: 2.58 u[IU]/mL (ref 0.450–4.500)

## 2022-11-09 MED ORDER — ROSUVASTATIN CALCIUM 10 MG PO TABS
10.0000 mg | ORAL_TABLET | Freq: Every day | ORAL | 3 refills | Status: DC
  Filled 2022-11-09: qty 90, 90d supply, fill #0
  Filled 2023-02-01: qty 90, 90d supply, fill #1
  Filled 2023-05-09: qty 90, 90d supply, fill #2
  Filled 2023-07-29: qty 90, 90d supply, fill #3

## 2022-11-11 ENCOUNTER — Encounter: Payer: Self-pay | Admitting: Family Medicine

## 2022-11-11 NOTE — Telephone Encounter (Addendum)
Was reviewing chart since patient sent a follow-up message.  I saw below our office had replied we did not plan to check lipids since PCP ordered for July. That is too soon to recheck. As per my note below, would still recommend rechecking f/u LFTs and fasting lipid panel in 2 months, not just LFTs. This would be mid August. Needs both checked. Please send along FYI to patient to let him know we ARE obtaining both as well once this is added to lab plan. Thank you so much!

## 2022-11-18 ENCOUNTER — Other Ambulatory Visit (HOSPITAL_COMMUNITY): Payer: Self-pay

## 2022-11-18 MED ORDER — DIAZEPAM 10 MG PO TABS
10.0000 mg | ORAL_TABLET | Freq: Every day | ORAL | 0 refills | Status: DC
  Filled 2022-11-18: qty 1, 1d supply, fill #0

## 2022-11-20 ENCOUNTER — Ambulatory Visit
Admission: RE | Admit: 2022-11-20 | Discharge: 2022-11-20 | Disposition: A | Discharge status: Home / Self Care | Payer: Commercial Managed Care - PPO | Source: Ambulatory Visit | Attending: Urology | Admitting: Urology

## 2022-11-20 DIAGNOSIS — R972 Elevated prostate specific antigen [PSA]: Secondary | ICD-10-CM | POA: Diagnosis not present

## 2022-11-20 MED ORDER — GADOPICLENOL 0.5 MMOL/ML IV SOLN
8.0000 mL | Freq: Once | INTRAVENOUS | Status: AC | PRN
  Administered 2022-11-20: 8 mL via INTRAVENOUS

## 2022-12-02 ENCOUNTER — Other Ambulatory Visit: Payer: Commercial Managed Care - PPO

## 2022-12-02 ENCOUNTER — Other Ambulatory Visit (HOSPITAL_COMMUNITY): Payer: Self-pay

## 2022-12-03 ENCOUNTER — Ambulatory Visit (HOSPITAL_COMMUNITY): Payer: Commercial Managed Care - PPO | Attending: Physician Assistant

## 2022-12-03 DIAGNOSIS — Z8249 Family history of ischemic heart disease and other diseases of the circulatory system: Secondary | ICD-10-CM | POA: Insufficient documentation

## 2022-12-03 DIAGNOSIS — I77810 Thoracic aortic ectasia: Secondary | ICD-10-CM

## 2022-12-03 DIAGNOSIS — E785 Hyperlipidemia, unspecified: Secondary | ICD-10-CM | POA: Diagnosis not present

## 2022-12-03 DIAGNOSIS — I10 Essential (primary) hypertension: Secondary | ICD-10-CM

## 2022-12-03 DIAGNOSIS — I491 Atrial premature depolarization: Secondary | ICD-10-CM | POA: Insufficient documentation

## 2022-12-03 DIAGNOSIS — I77819 Aortic ectasia, unspecified site: Secondary | ICD-10-CM | POA: Insufficient documentation

## 2022-12-03 DIAGNOSIS — I493 Ventricular premature depolarization: Secondary | ICD-10-CM | POA: Insufficient documentation

## 2022-12-06 ENCOUNTER — Ambulatory Visit (HOSPITAL_BASED_OUTPATIENT_CLINIC_OR_DEPARTMENT_OTHER)
Admission: RE | Admit: 2022-12-06 | Discharge: 2022-12-06 | Disposition: A | Discharge status: Home / Self Care | Payer: Commercial Managed Care - PPO | Source: Ambulatory Visit | Attending: Physician Assistant | Admitting: Physician Assistant

## 2022-12-06 ENCOUNTER — Other Ambulatory Visit (HOSPITAL_COMMUNITY): Payer: Self-pay

## 2022-12-06 DIAGNOSIS — I493 Ventricular premature depolarization: Secondary | ICD-10-CM | POA: Insufficient documentation

## 2022-12-06 DIAGNOSIS — Z8249 Family history of ischemic heart disease and other diseases of the circulatory system: Secondary | ICD-10-CM | POA: Insufficient documentation

## 2022-12-06 DIAGNOSIS — I77819 Aortic ectasia, unspecified site: Secondary | ICD-10-CM | POA: Insufficient documentation

## 2022-12-06 DIAGNOSIS — E785 Hyperlipidemia, unspecified: Secondary | ICD-10-CM | POA: Insufficient documentation

## 2022-12-06 DIAGNOSIS — I491 Atrial premature depolarization: Secondary | ICD-10-CM | POA: Insufficient documentation

## 2022-12-06 DIAGNOSIS — I2510 Atherosclerotic heart disease of native coronary artery without angina pectoris: Secondary | ICD-10-CM

## 2022-12-06 HISTORY — DX: Atherosclerotic heart disease of native coronary artery without angina pectoris: I25.10

## 2022-12-07 ENCOUNTER — Telehealth: Payer: Self-pay | Admitting: Physician Assistant

## 2022-12-07 NOTE — Telephone Encounter (Signed)
12:06 pm-Left message for pt to call back.

## 2022-12-07 NOTE — Telephone Encounter (Addendum)
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 He saw his calcium score results already, which are in the 78th percentile. Also shows aortic atherosclerosis which is very common. This reinforces the need for continuation of statin like we prescribed in June. The moderate intensity dose we started (rosuvastatin 10mg  daily) is still appropriate by [facility] calculator. We will need to follow up his lipids closely to make sure his LDL gets to goal. If his LDL remains over 70 on recheck in August, we'll plan to escalate the dose based on what the results look like. Would also continue baby aspirin. Coronary calcium in and of itself does not automatically mean there is an active blockage, but means there is a risk factor for progression of disease if risk factors are left untreated.   Please make sure patient has a lab appointment for mid August as well as fasting lipid panel and hepatic function panel ordered. I only see LFTs ordered under me and no lab appt scheduled. Thank you!

## 2022-12-08 ENCOUNTER — Other Ambulatory Visit: Payer: Self-pay

## 2022-12-08 DIAGNOSIS — Z79899 Other long term (current) drug therapy: Secondary | ICD-10-CM

## 2022-12-08 DIAGNOSIS — Z8249 Family history of ischemic heart disease and other diseases of the circulatory system: Secondary | ICD-10-CM

## 2022-12-08 DIAGNOSIS — E785 Hyperlipidemia, unspecified: Secondary | ICD-10-CM

## 2022-12-08 NOTE — Telephone Encounter (Signed)
Spoke with pt this morning. Told him results and notes Ronie Spies, PA had. Set pt up with follow up labs for August 26th for hepatic function panel and fasting lipid panel.

## 2022-12-14 ENCOUNTER — Telehealth: Payer: Self-pay

## 2022-12-14 DIAGNOSIS — I77819 Aortic ectasia, unspecified site: Secondary | ICD-10-CM

## 2022-12-14 LAB — ECHOCARDIOGRAM COMPLETE
Area-P 1/2: 3.91 cm2
P 1/2 time: 350 msec
S' Lateral: 2.9 cm

## 2022-12-14 NOTE — Telephone Encounter (Signed)
-----   Message from Laurann Montana sent at 12/14/2022  9:29 AM EDT ----- I sent result to MyChart to patient (he is a PA at [employer]). Can we put in for a 1 year echocardiogram to follow the mild dilation of the aorta? Would try to get the echo a few days before his 1 year follow-up, dx dilation of aorta. Thank you!

## 2022-12-14 NOTE — Telephone Encounter (Signed)
Results sent to patient via MyChart. Order for echo in 1 year has been placed.

## 2023-01-14 ENCOUNTER — Other Ambulatory Visit (HOSPITAL_COMMUNITY): Payer: Self-pay

## 2023-01-16 ENCOUNTER — Other Ambulatory Visit: Payer: Self-pay

## 2023-01-16 ENCOUNTER — Emergency Department (HOSPITAL_BASED_OUTPATIENT_CLINIC_OR_DEPARTMENT_OTHER)
Admission: EM | Admit: 2023-01-16 | Discharge: 2023-01-16 | Disposition: A | Discharge status: Home / Self Care | Payer: Commercial Managed Care - PPO | Attending: Emergency Medicine | Admitting: Emergency Medicine

## 2023-01-16 DIAGNOSIS — I10 Essential (primary) hypertension: Secondary | ICD-10-CM | POA: Insufficient documentation

## 2023-01-16 DIAGNOSIS — R339 Retention of urine, unspecified: Secondary | ICD-10-CM | POA: Diagnosis not present

## 2023-01-16 LAB — URINALYSIS, ROUTINE W REFLEX MICROSCOPIC
Bacteria, UA: NONE SEEN
Bilirubin Urine: NEGATIVE
Glucose, UA: NEGATIVE mg/dL
Ketones, ur: NEGATIVE mg/dL
Leukocytes,Ua: NEGATIVE
Nitrite: NEGATIVE
Protein, ur: NEGATIVE mg/dL
Specific Gravity, Urine: 1.01 (ref 1.005–1.030)
pH: 5 (ref 5.0–8.0)

## 2023-01-16 MED ORDER — LIDOCAINE HCL URETHRAL/MUCOSAL 2 % EX GEL
1.0000 | Freq: Once | CUTANEOUS | Status: AC
  Administered 2023-01-16: 1 via URETHRAL
  Filled 2023-01-16: qty 11

## 2023-01-16 NOTE — ED Triage Notes (Signed)
Pt POV from home, hx BPH, reports decreased urination today. Tearful in triage, feels the urge to urinate but can't. Bladder scan in triage

## 2023-01-16 NOTE — Discharge Instructions (Addendum)
You were evaluated in the Emergency Department and after careful evaluation, we did not find any emergent condition requiring admission or further testing in the hospital.  Your exam/testing today is overall reassuring.  We placed a Foley catheter here in the emergency department.  Your urinalysis did not show signs of infection.  We sent it for culture.  Recommend close follow-up with your urologist for further management.  Please return to the Emergency Department if you experience any worsening of your condition.   Thank you for allowing [place] to be a part of your care.

## 2023-01-16 NOTE — ED Notes (Signed)
Reviewed AVS with patient, patient expressed understanding of directions, denies further questions at this time. 

## 2023-01-16 NOTE — ED Provider Notes (Signed)
[facility] EMERGENCY [facility] Emergency Department Provider Note MRN:  [id number]  Arrival date & time: 01/16/23     Chief Complaint   Urinary Retention   History of Present Illness   Eugene Brown is a 57 y.o. year-old male with a history of BPH presenting to the ED with chief complaint of urinary retention.  Trying to urinate frequently today but unable to void very much at all.  Becoming more and more uncomfortable with suprapubic pressure and pain.  No recent fever or illness.  Has known enlarged prostate follows with urology.  Took a double dose of Flomax today but it did not help.  Review of Systems  A thorough review of systems was obtained and all systems are negative except as noted in the HPI and PMH.   Patient's Health History    Past Medical History:  Diagnosis Date   GERD (gastroesophageal reflux disease)    HTN (hypertension)    Mild dilation of ascending aorta (HCC)    Premature atrial contractions    PVC's (premature ventricular contractions)     Past Surgical History:  Procedure Laterality Date   CERVICAL SPINE SURGERY      Family History  Problem Relation Age of Onset   CAD Mother        CABG   Atrial fibrillation Father     Social History   Socioeconomic History   Marital status: Married    Spouse name: Not on file   Number of children: 2   Years of education: Not on file   Highest education level: Master's degree (e.g., MA, MS, MEng, MEd, MSW, MBA)  Occupational History   Occupation: Doctor, general practice at [employer]  Tobacco Use   Smoking status: Never   Smokeless tobacco: Never   Tobacco comments:    no tobacco   Substance and Sexual Activity   Alcohol use: No   Drug use: No   Sexual activity: Yes  Other Topics Concern   Not on file  Social History Narrative   Married, 2 children.    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    No significant caffeine use; no otc supplements    Social Determinants of Health   Financial  Resource Strain: Low Risk  (08/22/2022)   Overall Financial Resource Strain (CARDIA)    Difficulty of Paying Living Expenses: Not very hard  Food Insecurity: No Food Insecurity (08/22/2022)   Hunger Vital Sign    Worried About Running Out of Food in the Last Year: Never true    Ran Out of Food in the Last Year: Never true  Transportation Needs: No Transportation Needs (08/22/2022)   PRAPARE - Administrator, Civil Service (Medical): No    Lack of Transportation (Non-Medical): No  Physical Activity: Insufficiently Active (08/22/2022)   Exercise Vital Sign    Days of Exercise per Week: 1 day    Minutes of Exercise per Session: 10 min  Stress: No Stress Concern Present (08/22/2022)   [employer] of Occupational Health - Occupational Stress Questionnaire    Feeling of Stress : Only a little  Social Connections: Socially Integrated (08/22/2022)   Social Connection and Isolation Panel [NHANES]    Frequency of Communication with Friends and Family: Three times a week    Frequency of Social Gatherings with Friends and Family: Once a week    Attends Religious Services: More than 4 times per year    Active Member of Clubs or Organizations: Yes  Attends Banker Meetings: 1 to 4 times per year    Marital Status: Married  Catering manager Violence: Not on file     Physical Exam   Vitals:   01/16/23 0100 01/16/23 0115  BP: 117/81 122/80  Pulse: 68 60  Resp: 16   Temp: 98 F (36.7 C)   SpO2: 95% 93%    CONSTITUTIONAL: Well-appearing, NAD NEURO/PSYCH:  Alert and oriented x 3, no focal deficits EYES:  eyes equal and reactive ENT/NECK:  no LAD, no JVD CARDIO: Regular rate, well-perfused, normal S1 and S2 PULM:  CTAB no wheezing or rhonchi GI/GU:  non-distended, non-tender MSK/SPINE:  No gross deformities, no edema SKIN:  no rash, atraumatic   *Additional and/or pertinent findings included in MDM below  Diagnostic and Interventional Summary    EKG  Interpretation Date/Time:    Ventricular Rate:    PR Interval:    QRS Duration:    QT Interval:    QTC Calculation:   R Axis:      Text Interpretation:         Labs Reviewed  URINALYSIS, ROUTINE W REFLEX MICROSCOPIC - Abnormal; Notable for the following components:      Result Value   Hgb urine dipstick TRACE (*)    All other components within normal limits  URINE CULTURE    No orders to display    Medications  lidocaine (XYLOCAINE) 2 % jelly 1 Application (1 Application Urethral Given 01/16/23 0033)     Procedures  /  Critical Care Procedures  ED Course and Medical Decision Making  Initial Impression and Ddx Urinary retention suspect due to BPH.  UTI is also considered.  Patient seems much more comfortable after Foley catheter placement.  Will send urine, reassess.  Past medical/surgical history that increases complexity of ED encounter:  BPH  Interpretation of Diagnostics I personally reviewed the UA and my interpretation is as follows:  no obvious infection    Patient Reassessment and Ultimate Disposition/Management     Still feeling well on reassessment, appropriate for discharge with urology follow up.  Patient management required discussion with the following services or consulting groups:  None  Complexity of Problems Addressed Acute illness or injury that poses threat of life of bodily function  Additional Data Reviewed and Analyzed Further history obtained from: Further history from spouse/family member  Additional Factors Impacting ED Encounter Risk None  Elmer Sow. Pilar Plate, MD [facility] Emergency Medicine [facility] [email or web address]  Final Clinical Impressions(s) / ED Diagnoses     ICD-10-CM   1. Urinary retention  R33.9       ED Discharge Orders     None        Discharge Instructions Discussed with and Provided to Patient:     Discharge Instructions      You were evaluated in the Emergency Department and  after careful evaluation, we did not find any emergent condition requiring admission or further testing in the hospital.  Your exam/testing today is overall reassuring.  We placed a Foley catheter here in the emergency department.  Your urinalysis did not show signs of infection.  We sent it for culture.  Recommend close follow-up with your urologist for further management.  Please return to the Emergency Department if you experience any worsening of your condition.   Thank you for allowing [place] to be a part of your care.       Sabas Sous, MD 01/16/23 [phone]

## 2023-01-17 ENCOUNTER — Ambulatory Visit: Payer: Commercial Managed Care - PPO | Attending: Physician Assistant

## 2023-01-17 DIAGNOSIS — E785 Hyperlipidemia, unspecified: Secondary | ICD-10-CM

## 2023-01-17 DIAGNOSIS — Z8249 Family history of ischemic heart disease and other diseases of the circulatory system: Secondary | ICD-10-CM | POA: Diagnosis not present

## 2023-01-17 DIAGNOSIS — Z79899 Other long term (current) drug therapy: Secondary | ICD-10-CM | POA: Diagnosis not present

## 2023-01-17 LAB — LIPID PANEL
Chol/HDL Ratio: 2.3 ratio (ref 0.0–5.0)
Cholesterol, Total: 129 mg/dL (ref 100–199)
HDL: 56 mg/dL (ref >39)
LDL Chol Calc (NIH): 59 mg/dL (ref 0–99)
Triglycerides: 66 mg/dL (ref 0–149)
VLDL Cholesterol Cal: 14 mg/dL (ref 5–40)

## 2023-01-17 LAB — HEPATIC FUNCTION PANEL
ALT: 43 IU/L (ref 0–44)
AST: 31 IU/L (ref 0–40)
Albumin: 4.9 g/dL (ref 3.8–4.9)
Alkaline Phosphatase: 71 IU/L (ref 44–121)
Bilirubin Total: 0.5 mg/dL (ref 0.0–1.2)
Bilirubin, Direct: 0.17 mg/dL (ref 0.00–0.40)
Total Protein: 6.8 g/dL (ref 6.0–8.5)

## 2023-01-17 LAB — URINE CULTURE: Culture: NO GROWTH

## 2023-01-18 ENCOUNTER — Other Ambulatory Visit: Payer: Self-pay

## 2023-01-18 ENCOUNTER — Other Ambulatory Visit (HOSPITAL_COMMUNITY): Payer: Self-pay

## 2023-01-18 DIAGNOSIS — N401 Enlarged prostate with lower urinary tract symptoms: Secondary | ICD-10-CM | POA: Diagnosis not present

## 2023-01-18 DIAGNOSIS — R338 Other retention of urine: Secondary | ICD-10-CM | POA: Diagnosis not present

## 2023-01-18 DIAGNOSIS — R972 Elevated prostate specific antigen [PSA]: Secondary | ICD-10-CM | POA: Diagnosis not present

## 2023-01-18 MED ORDER — FINASTERIDE 5 MG PO TABS
5.0000 mg | ORAL_TABLET | Freq: Every day | ORAL | 3 refills | Status: DC
  Filled 2023-01-18 (×2): qty 90, 90d supply, fill #0

## 2023-01-18 MED ORDER — TAMSULOSIN HCL 0.4 MG PO CAPS
0.8000 mg | ORAL_CAPSULE | Freq: Every evening | ORAL | 3 refills | Status: DC
  Filled 2023-01-18 – 2023-02-02 (×3): qty 180, 90d supply, fill #0

## 2023-01-19 ENCOUNTER — Other Ambulatory Visit: Payer: Self-pay

## 2023-01-22 ENCOUNTER — Emergency Department (HOSPITAL_BASED_OUTPATIENT_CLINIC_OR_DEPARTMENT_OTHER)
Admission: EM | Admit: 2023-01-22 | Discharge: 2023-01-22 | Disposition: A | Discharge status: Home / Self Care | Payer: Commercial Managed Care - PPO | Attending: Emergency Medicine | Admitting: Emergency Medicine

## 2023-01-22 ENCOUNTER — Other Ambulatory Visit: Payer: Self-pay

## 2023-01-22 ENCOUNTER — Encounter (HOSPITAL_BASED_OUTPATIENT_CLINIC_OR_DEPARTMENT_OTHER): Payer: Self-pay

## 2023-01-22 ENCOUNTER — Other Ambulatory Visit (HOSPITAL_BASED_OUTPATIENT_CLINIC_OR_DEPARTMENT_OTHER): Payer: Self-pay

## 2023-01-22 DIAGNOSIS — R339 Retention of urine, unspecified: Secondary | ICD-10-CM | POA: Diagnosis present

## 2023-01-22 DIAGNOSIS — R310 Gross hematuria: Secondary | ICD-10-CM | POA: Insufficient documentation

## 2023-01-22 DIAGNOSIS — Z7982 Long term (current) use of aspirin: Secondary | ICD-10-CM | POA: Insufficient documentation

## 2023-01-22 DIAGNOSIS — I10 Essential (primary) hypertension: Secondary | ICD-10-CM | POA: Diagnosis not present

## 2023-01-22 LAB — URINALYSIS, W/ REFLEX TO CULTURE (INFECTION SUSPECTED)
Bilirubin Urine: NEGATIVE
Glucose, UA: NEGATIVE mg/dL
Ketones, ur: NEGATIVE mg/dL
Nitrite: POSITIVE — AB
Protein, ur: 30 mg/dL — AB
RBC / HPF: >50 RBC/hpf (ref 0–5)
Specific Gravity, Urine: 1.013 (ref 1.005–1.030)
WBC, UA: >50 WBC/hpf (ref 0–5)
pH: 6 (ref 5.0–8.0)

## 2023-01-22 MED ORDER — CEPHALEXIN 500 MG PO CAPS
500.0000 mg | ORAL_CAPSULE | Freq: Two times a day (BID) | ORAL | 0 refills | Status: AC
  Filled 2023-01-22: qty 14, 7d supply, fill #0

## 2023-01-22 NOTE — ED Provider Notes (Signed)
  [place] EMERGENCY DEPARTMENT AT [facility] Provider Note   CSN: [id number] Arrival date & time: 01/22/23  1010     History {Add pertinent medical, surgical, social history, OB history to HPI:1} Chief Complaint  Patient presents with   Urinary Retention    Eugene Brown is a 57 y.o. male.  HPI     Home Medications Prior to Admission medications   Medication Sig Start Date End Date Taking? Authorizing Provider  aspirin 81 MG tablet Take 81 mg by mouth daily.      [provider]  diazepam (VALIUM) 10 MG tablet Take 1 tablet (10 mg total) by mouth 1 hour prior to MRI 11/18/22     finasteride (PROSCAR) 5 MG tablet Take 1 tablet (5 mg total) by mouth daily. 01/18/23     losartan (COZAAR) 50 MG tablet Take 1 tablet (50 mg total) by mouth daily. 08/26/22   Shade Flood, MD  metoprolol succinate (TOPROL XL) 25 MG 24 hr tablet Take 1 tablet (25 mg total) by mouth daily. 11/08/22   Dunn, Tacey Ruiz, PA-C  Multiple Vitamin (MULTIVITAMIN) tablet Take 1 tablet by mouth daily.      [provider]  Omega-3 Fatty Acids (FISH OIL) 1200 MG CAPS Take 1 capsule by mouth 2 (two) times daily.      [provider]  rosuvastatin (CRESTOR) 10 MG tablet Take 1 tablet (10 mg total) by mouth daily. 11/09/22 02/08/23  Dunn, Tacey Ruiz, PA-C  tamsulosin (FLOMAX) 0.4 MG CAPS capsule Take 1 capsule (0.4 mg total) by mouth daily at bedtime. 09/14/22     tamsulosin (FLOMAX) 0.4 MG CAPS capsule Take 2 capsules (0.8 mg total) by mouth at bedtime. 01/18/23         Allergies    Patient has no known allergies.    Review of Systems   Review of Systems  Physical Exam Updated Vital Signs BP 136/89 (BP Location: Right Arm)   Pulse 72   Temp 99.6 F (37.6 C) (Oral)   Resp 16   Ht 5\' 11"  (1.803 m)   Wt 81.6 kg   SpO2 97%   BMI 25.10 kg/m  Physical Exam  ED Results / Procedures / Treatments   Labs (all labs ordered are listed, but only abnormal results are  displayed) Labs Reviewed - No data to display  EKG None  Radiology No results found.  Procedures Procedures  {Document cardiac monitor, telemetry assessment procedure when appropriate:1}  Medications Ordered in ED Medications - No data to display  ED Course/ Medical Decision Making/ A&P   {   Click here for ABCD2, HEART and other calculatorsREFRESH Note before signing :1}                              Medical Decision Making  ***  {Document critical care time when appropriate:1} {Document review of labs and clinical decision tools ie heart score, Chads2Vasc2 etc:1}  {Document your independent review of radiology images, and any outside records:1} {Document your discussion with family members, caretakers, and with consultants:1} {Document social determinants of health affecting pt's care:1} {Document your decision making why or why not admission, treatments were needed:1} Final Clinical Impression(s) / ED Diagnoses Final diagnoses:  None    Rx / DC Orders ED Discharge Orders     None

## 2023-01-22 NOTE — ED Triage Notes (Signed)
Present with foley catheter with blood in urine.  States had increase pain relieved and now draining blood in urine and blood clots

## 2023-01-24 LAB — URINE CULTURE: Culture: >=100000 — AB

## 2023-01-25 ENCOUNTER — Telehealth (HOSPITAL_BASED_OUTPATIENT_CLINIC_OR_DEPARTMENT_OTHER): Payer: Self-pay | Admitting: *Deleted

## 2023-01-25 DIAGNOSIS — R338 Other retention of urine: Secondary | ICD-10-CM | POA: Diagnosis not present

## 2023-01-25 DIAGNOSIS — N401 Enlarged prostate with lower urinary tract symptoms: Secondary | ICD-10-CM | POA: Diagnosis not present

## 2023-01-25 DIAGNOSIS — R972 Elevated prostate specific antigen [PSA]: Secondary | ICD-10-CM | POA: Diagnosis not present

## 2023-01-25 NOTE — Telephone Encounter (Signed)
Post ED Visit - Positive Culture Follow-up  Culture report reviewed by antimicrobial stewardship pharmacist: [facility] []  Enzo Bi, Pharm.D. [x]  Celedonio Miyamoto, Pharm.D., BCPS AQ-ID []  Garvin Fila, Pharm.D., BCPS []  Georgina Pillion, Pharm.D., BCPS []  Index, [address].D., BCPS, AAHIVP []  Estella Husk, Pharm.D., BCPS, AAHIVP []  Lysle Pearl, PharmD, BCPS []  Phillips Climes, PharmD, BCPS []  Agapito Games, PharmD, BCPS []  Verlan Friends, PharmD []  Mervyn Gay, PharmD, BCPS []  Vinnie Level, PharmD  [facility] []  Len Childs, PharmD []  Greer Pickerel, PharmD []  Adalberto Cole, PharmD []  Perlie Gold, Rph []  Lonell Face) Jean Rosenthal, PharmD []  Earl Many, PharmD []  Junita Push, PharmD []  Dorna Leitz, PharmD []  Terrilee Files, PharmD []  Lynann Beaver, PharmD []  Keturah Barre, PharmD []  Loralee Pacas, PharmD []  Bernadene Person, PharmD   Positive urine culture Treated with Cephalexin, organism sensitive to the same and no further patient follow-up is required at this time.  Virl Axe [facility] 01/25/2023, 9:25 AM

## 2023-01-27 ENCOUNTER — Emergency Department (HOSPITAL_BASED_OUTPATIENT_CLINIC_OR_DEPARTMENT_OTHER): Payer: Commercial Managed Care - PPO

## 2023-01-27 ENCOUNTER — Emergency Department (HOSPITAL_BASED_OUTPATIENT_CLINIC_OR_DEPARTMENT_OTHER): Payer: Commercial Managed Care - PPO | Admitting: Radiology

## 2023-01-27 ENCOUNTER — Inpatient Hospital Stay (HOSPITAL_BASED_OUTPATIENT_CLINIC_OR_DEPARTMENT_OTHER)
Admission: EM | Admit: 2023-01-27 | Discharge: 2023-01-31 | DRG: 698 | Disposition: A | Discharge status: Home / Self Care | Payer: Commercial Managed Care - PPO | Attending: Internal Medicine | Admitting: Internal Medicine

## 2023-01-27 ENCOUNTER — Encounter (HOSPITAL_BASED_OUTPATIENT_CLINIC_OR_DEPARTMENT_OTHER): Payer: Self-pay | Admitting: Emergency Medicine

## 2023-01-27 ENCOUNTER — Other Ambulatory Visit: Payer: Self-pay

## 2023-01-27 DIAGNOSIS — I77810 Thoracic aortic ectasia: Secondary | ICD-10-CM | POA: Diagnosis not present

## 2023-01-27 DIAGNOSIS — R509 Fever, unspecified: Secondary | ICD-10-CM

## 2023-01-27 DIAGNOSIS — I10 Essential (primary) hypertension: Secondary | ICD-10-CM | POA: Diagnosis present

## 2023-01-27 DIAGNOSIS — E872 Acidosis, unspecified: Secondary | ICD-10-CM | POA: Diagnosis present

## 2023-01-27 DIAGNOSIS — R7401 Elevation of levels of liver transaminase levels: Secondary | ICD-10-CM | POA: Diagnosis not present

## 2023-01-27 DIAGNOSIS — Z87442 Personal history of urinary calculi: Secondary | ICD-10-CM | POA: Diagnosis not present

## 2023-01-27 DIAGNOSIS — Z7982 Long term (current) use of aspirin: Secondary | ICD-10-CM | POA: Diagnosis not present

## 2023-01-27 DIAGNOSIS — K219 Gastro-esophageal reflux disease without esophagitis: Secondary | ICD-10-CM | POA: Diagnosis not present

## 2023-01-27 DIAGNOSIS — N390 Urinary tract infection, site not specified: Secondary | ICD-10-CM | POA: Diagnosis present

## 2023-01-27 DIAGNOSIS — R140 Abdominal distension (gaseous): Secondary | ICD-10-CM | POA: Diagnosis not present

## 2023-01-27 DIAGNOSIS — Y846 Urinary catheterization as the cause of abnormal reaction of the patient, or of later complication, without mention of misadventure at the time of the procedure: Secondary | ICD-10-CM | POA: Diagnosis present

## 2023-01-27 DIAGNOSIS — N12 Tubulo-interstitial nephritis, not specified as acute or chronic: Secondary | ICD-10-CM | POA: Diagnosis not present

## 2023-01-27 DIAGNOSIS — A4151 Sepsis due to Escherichia coli [E. coli]: Secondary | ICD-10-CM | POA: Diagnosis present

## 2023-01-27 DIAGNOSIS — R338 Other retention of urine: Secondary | ICD-10-CM | POA: Diagnosis present

## 2023-01-27 DIAGNOSIS — Z1152 Encounter for screening for COVID-19: Secondary | ICD-10-CM

## 2023-01-27 DIAGNOSIS — N3289 Other specified disorders of bladder: Secondary | ICD-10-CM | POA: Diagnosis not present

## 2023-01-27 DIAGNOSIS — R339 Retention of urine, unspecified: Secondary | ICD-10-CM | POA: Diagnosis not present

## 2023-01-27 DIAGNOSIS — Z79899 Other long term (current) drug therapy: Secondary | ICD-10-CM | POA: Diagnosis not present

## 2023-01-27 DIAGNOSIS — T83511A Infection and inflammatory reaction due to indwelling urethral catheter, initial encounter: Principal | ICD-10-CM | POA: Diagnosis present

## 2023-01-27 DIAGNOSIS — R319 Hematuria, unspecified: Secondary | ICD-10-CM | POA: Diagnosis not present

## 2023-01-27 DIAGNOSIS — A419 Sepsis, unspecified organism: Secondary | ICD-10-CM | POA: Diagnosis present

## 2023-01-27 DIAGNOSIS — R972 Elevated prostate specific antigen [PSA]: Secondary | ICD-10-CM | POA: Diagnosis not present

## 2023-01-27 DIAGNOSIS — Z8249 Family history of ischemic heart disease and other diseases of the circulatory system: Secondary | ICD-10-CM | POA: Diagnosis not present

## 2023-01-27 DIAGNOSIS — N401 Enlarged prostate with lower urinary tract symptoms: Secondary | ICD-10-CM | POA: Diagnosis not present

## 2023-01-27 DIAGNOSIS — K5900 Constipation, unspecified: Secondary | ICD-10-CM | POA: Diagnosis not present

## 2023-01-27 DIAGNOSIS — N200 Calculus of kidney: Secondary | ICD-10-CM | POA: Diagnosis not present

## 2023-01-27 DIAGNOSIS — Z981 Arthrodesis status: Secondary | ICD-10-CM | POA: Diagnosis not present

## 2023-01-27 HISTORY — DX: Sepsis, unspecified organism: A41.9

## 2023-01-27 LAB — COMPREHENSIVE METABOLIC PANEL
ALT: 134 U/L — ABNORMAL HIGH (ref 0–44)
AST: 73 U/L — ABNORMAL HIGH (ref 15–41)
Albumin: 4.1 g/dL (ref 3.5–5.0)
Alkaline Phosphatase: 101 U/L (ref 38–126)
Anion gap: 10 (ref 5–15)
BUN: 16 mg/dL (ref 6–20)
CO2: 25 mmol/L (ref 22–32)
Calcium: 9.2 mg/dL (ref 8.9–10.3)
Chloride: 105 mmol/L (ref 98–111)
Creatinine, Ser: 1.12 mg/dL (ref 0.61–1.24)
GFR, Estimated: >60 mL/min (ref >60)
Glucose, Bld: 114 mg/dL — ABNORMAL HIGH (ref 70–99)
Potassium: 4.4 mmol/L (ref 3.5–5.1)
Sodium: 140 mmol/L (ref 135–145)
Total Bilirubin: 0.8 mg/dL (ref 0.3–1.2)
Total Protein: 7.1 g/dL (ref 6.5–8.1)

## 2023-01-27 LAB — SARS CORONAVIRUS 2 BY RT PCR: SARS Coronavirus 2 by RT PCR: NEGATIVE

## 2023-01-27 LAB — CBC WITH DIFFERENTIAL/PLATELET
Abs Immature Granulocytes: 0.06 10*3/uL (ref 0.00–0.07)
Basophils Absolute: 0 10*3/uL (ref 0.0–0.1)
Basophils Relative: 0 %
Eosinophils Absolute: 0 10*3/uL (ref 0.0–0.5)
Eosinophils Relative: 0 %
HCT: 41.2 % (ref 39.0–52.0)
Hemoglobin: 14 g/dL (ref 13.0–17.0)
Immature Granulocytes: 1 %
Lymphocytes Relative: 6 %
Lymphs Abs: 0.7 10*3/uL (ref 0.7–4.0)
MCH: 28.9 pg (ref 26.0–34.0)
MCHC: 34 g/dL (ref 30.0–36.0)
MCV: 85.1 fL (ref 80.0–100.0)
Monocytes Absolute: 1.6 10*3/uL — ABNORMAL HIGH (ref 0.1–1.0)
Monocytes Relative: 14 %
Neutro Abs: 8.6 10*3/uL — ABNORMAL HIGH (ref 1.7–7.7)
Neutrophils Relative %: 79 %
Platelets: 216 10*3/uL (ref 150–400)
RBC: 4.84 MIL/uL (ref 4.22–5.81)
RDW: 11.9 % (ref 11.5–15.5)
WBC: 11 10*3/uL — ABNORMAL HIGH (ref 4.0–10.5)
nRBC: 0 % (ref 0.0–0.2)

## 2023-01-27 LAB — URINALYSIS, ROUTINE W REFLEX MICROSCOPIC
Bilirubin Urine: NEGATIVE
Glucose, UA: NEGATIVE mg/dL
Ketones, ur: NEGATIVE mg/dL
Nitrite: NEGATIVE
RBC / HPF: >50 RBC/hpf (ref 0–5)
Specific Gravity, Urine: 1.02 (ref 1.005–1.030)
WBC, UA: >50 WBC/hpf (ref 0–5)
pH: 6 (ref 5.0–8.0)

## 2023-01-27 LAB — LACTIC ACID, PLASMA
Lactic Acid, Venous: 2.2 mmol/L (ref 0.5–1.9)
Lactic Acid, Venous: 1.1 mmol/L (ref 0.5–1.9)

## 2023-01-27 LAB — LIPASE, BLOOD: Lipase: 15 U/L (ref 11–51)

## 2023-01-27 MED ORDER — LOSARTAN POTASSIUM 50 MG PO TABS
50.0000 mg | ORAL_TABLET | Freq: Every day | ORAL | Status: DC
  Administered 2023-01-28 – 2023-01-31 (×4): 50 mg via ORAL
  Filled 2023-01-27 (×4): qty 1

## 2023-01-27 MED ORDER — SODIUM CHLORIDE 0.9 % IV SOLN
2.0000 g | Freq: Three times a day (TID) | INTRAVENOUS | Status: DC
  Administered 2023-01-27 – 2023-01-31 (×12): 2 g via INTRAVENOUS
  Filled 2023-01-27 (×12): qty 12.5

## 2023-01-27 MED ORDER — ROSUVASTATIN CALCIUM 10 MG PO TABS: 10.0000 mg | ORAL_TABLET | Freq: Every day | ORAL | Status: DC

## 2023-01-27 MED ORDER — LACTATED RINGERS IV BOLUS
1000.0000 mL | Freq: Once | INTRAVENOUS | Status: AC
  Administered 2023-01-27: 1000 mL via INTRAVENOUS

## 2023-01-27 MED ORDER — ACETAMINOPHEN 325 MG PO TABS
650.0000 mg | ORAL_TABLET | Freq: Four times a day (QID) | ORAL | Status: DC | PRN
  Administered 2023-01-27 – 2023-01-28 (×4): 650 mg via ORAL
  Filled 2023-01-27 (×4): qty 2

## 2023-01-27 MED ORDER — LACTATED RINGERS IV SOLN
150.0000 mL/h | INTRAVENOUS | Status: AC
  Administered 2023-01-27: 150 mL/h via INTRAVENOUS

## 2023-01-27 MED ORDER — ONDANSETRON HCL 4 MG/2ML IJ SOLN: 4.0000 mg | Freq: Four times a day (QID) | INTRAMUSCULAR | Status: DC | PRN

## 2023-01-27 MED ORDER — ONDANSETRON HCL 4 MG PO TABS: 4.0000 mg | ORAL_TABLET | Freq: Four times a day (QID) | ORAL | Status: DC | PRN

## 2023-01-27 MED ORDER — LORAZEPAM 1 MG PO TABS
1.0000 mg | ORAL_TABLET | Freq: Once | ORAL | Status: AC
  Administered 2023-01-27: 1 mg via ORAL
  Filled 2023-01-27: qty 1

## 2023-01-27 MED ORDER — LACTATED RINGERS IV BOLUS
500.0000 mL | Freq: Once | INTRAVENOUS | Status: AC
  Administered 2023-01-27: 500 mL via INTRAVENOUS

## 2023-01-27 MED ORDER — FINASTERIDE 5 MG PO TABS
5.0000 mg | ORAL_TABLET | Freq: Every day | ORAL | Status: DC
  Administered 2023-01-28 – 2023-01-31 (×4): 5 mg via ORAL
  Filled 2023-01-27 (×4): qty 1

## 2023-01-27 MED ORDER — SODIUM CHLORIDE 0.9 % IV SOLN: INTRAVENOUS | Status: DC | PRN

## 2023-01-27 MED ORDER — OXYCODONE HCL 5 MG PO TABS: 5.0000 mg | ORAL_TABLET | Freq: Four times a day (QID) | ORAL | Status: DC | PRN

## 2023-01-27 MED ORDER — LACTATED RINGERS IV BOLUS (SEPSIS)
500.0000 mL | Freq: Once | INTRAVENOUS | Status: AC
  Administered 2023-01-27: 500 mL via INTRAVENOUS

## 2023-01-27 MED ORDER — TAMSULOSIN HCL 0.4 MG PO CAPS
0.8000 mg | ORAL_CAPSULE | Freq: Every day | ORAL | Status: DC
  Administered 2023-01-27 – 2023-01-30 (×4): 0.8 mg via ORAL
  Filled 2023-01-27 (×4): qty 2

## 2023-01-27 MED ORDER — METOPROLOL SUCCINATE ER 25 MG PO TB24
25.0000 mg | ORAL_TABLET | Freq: Every day | ORAL | Status: DC
  Administered 2023-01-28 – 2023-01-31 (×4): 25 mg via ORAL
  Filled 2023-01-27 (×4): qty 1

## 2023-01-27 MED ORDER — LACTATED RINGERS IV BOLUS (SEPSIS)
1000.0000 mL | Freq: Once | INTRAVENOUS | Status: AC
  Administered 2023-01-27: 1000 mL via INTRAVENOUS

## 2023-01-27 MED ORDER — KETOROLAC TROMETHAMINE 30 MG/ML IJ SOLN
30.0000 mg | Freq: Three times a day (TID) | INTRAMUSCULAR | Status: AC | PRN
  Administered 2023-01-28 – 2023-01-29 (×5): 30 mg via INTRAVENOUS
  Filled 2023-01-27 (×5): qty 1

## 2023-01-27 MED ORDER — KETOROLAC TROMETHAMINE 30 MG/ML IJ SOLN
30.0000 mg | Freq: Once | INTRAMUSCULAR | Status: AC
  Administered 2023-01-27: 30 mg via INTRAVENOUS
  Filled 2023-01-27: qty 1

## 2023-01-27 MED ORDER — SODIUM CHLORIDE 0.9 % IV SOLN
2.0000 g | Freq: Once | INTRAVENOUS | Status: AC
  Administered 2023-01-27: 2 g via INTRAVENOUS
  Filled 2023-01-27: qty 12.5

## 2023-01-27 MED ORDER — ONDANSETRON HCL 4 MG/2ML IJ SOLN
4.0000 mg | Freq: Once | INTRAMUSCULAR | Status: DC
  Filled 2023-01-27: qty 2

## 2023-01-27 MED ORDER — HYDROMORPHONE HCL 1 MG/ML IJ SOLN: 0.5000 mg | INTRAMUSCULAR | Status: DC | PRN

## 2023-01-27 NOTE — ED Notes (Signed)
Attempted to call report to IP RN. RN busy at this time and unable to take report.

## 2023-01-27 NOTE — ED Notes (Signed)
Eugene Brown with cl called for transport

## 2023-01-27 NOTE — Plan of Care (Signed)
  Problem: Education: Goal: Knowledge of General Education information will improve Description: Including pain rating scale, medication(s)/side effects and non-pharmacologic comfort measures Outcome: Progressing   Problem: Health Behavior/Discharge Planning: Goal: Ability to manage health-related needs will improve Outcome: Progressing   Problem: Activity: Goal: Risk for activity intolerance will decrease Outcome: Progressing   Problem: Coping: Goal: Level of anxiety will decrease Outcome: Not Progressing   Problem: Elimination: Goal: Will not experience complications related to urinary retention Outcome: Not Progressing   Problem: Clinical Measurements: Goal: Diagnostic test results will improve Outcome: Not Progressing Note: Temp elevated even after PO tylenol given. MD has been notified.

## 2023-01-27 NOTE — ED Provider Notes (Signed)
7:02 AM Patient signed out to me by previous ED physician. Pt is a 57 yo male presenting for urinary retention, night sweats, and fevers. Recent foley removed 2 days ago for retention and UTI. Still taking Keflex. Cultures were sensitive.  Plan: r/o sepsis, renal stone, foley cath in.       Physical Exam  BP (!) 144/94   Pulse 69   Temp 97.9 F (36.6 C) (Oral)   Resp (!) 26   Wt 81.6 kg   SpO2 100%   BMI 25.10 kg/m   Physical Exam  Procedures  Procedures  ED Course / MDM    Medical Decision Making Amount and/or Complexity of Data Reviewed Labs: ordered. Radiology: ordered.  Risk Prescription drug management. Decision regarding hospitalization.   UA positive for urinary tract infection with hematuria.  CT scan demonstrates punctate small right lower pole renal stone.  No ureterolithiasis.  No obstruction.  Perinephric stranding concerning for pyelonephritis.  IV antibiotics started.  IV fluids already given.  Foley catheter in and bladder collapsed.  Recommending admission for sepsis secondary to pyelonephritis and failed antibiotic outpatient therapy.  Sepsis criteria includes tachypnea of 28, home fevers, and acidosis of 2.2.  Cefepime started.  Completed the rest of the 30 cc/kg fluid bolus for lactic acidosis.  Spoke with urology they will be following patient.  Patient accepted by Dr. Robb Matar.       Edwin Dada P, DO 01/27/23 [phone]

## 2023-01-27 NOTE — H&P (Signed)
History and Physical    Patient: Eugene Brown ZOX:[id number] DOB: 11-16-1965 DOA: 01/27/2023 DOS: the patient was seen and examined on 01/27/2023 PCP: Shade Flood, MD  Patient coming from: Home  Chief Complaint:  Chief Complaint  Patient presents with   Urinary Retention   HPI: Eugene Brown is a 57 y.o. male with medical history significant of GERD, hypertension, mild dilation of the ascending aorta, PACs, PVCs who recently had an episode of urinary retention requiring Foley catheter insertion, this was followed by an E. coli UTI that was sensitive to cephalexin.  He spent 9 days with a Foley catheter inserted.  This was discontinued 2 days ago after the patient passed a voiding trial.  He was doing well until early this morning when he became febrile with chills, night sweats, fatigue and malaise.  He has has some mild dysuria, but no flank pain, oliguria or gross hematuria. He denied rhinorrhea, sore throat, wheezing or hemoptysis.  No chest pain, palpitations, diaphoresis, PND, orthopnea or pitting edema of the lower extremities.  No abdominal pain, nausea, emesis, diarrhea, constipation, melena or hematochezia.  No polyuria, polydipsia or polyphagia  Lab work: His urinalysis was hazy with large hemoglobin, trace protein and moderate leukocyte esterase.  Microscopic examination shows greater than 50 RBC, greater than 50 WBC with rare bacteria.  CBC with a white count 11.0 with 79% neutrophils, hemoglobin 14.0 g/dL platelets 811.  Lipase was normal.  CMP showed a glucose of 114 mg/dL, an AST of 73 and ALT 914 units/L.  Rest of the CMP measurements were normal.  Lactic acid is 2.2 then 1.1 mmol/L.  Imaging: 2 view chest radiograph with no acute cardiopulmonary pathology.  CT renal study showing decompressed by Foley catheter bladder.  Prostatomegaly.  Punctate right nephrolithiasis with no evidence of renal obstruction.  ED course: Initial vital signs were temperature 97.9 F, pulse  69, respirations 26, BP 144/94 mmHg O2 sat 100% on room air.  Patient received LR 2500 mL bolus, cefepime 2 g IVPB and lorazepam 1 mg IVP.  Review of Systems: As mentioned in the history of present illness. All other systems reviewed and are negative.  Past Medical History:  Diagnosis Date   GERD (gastroesophageal reflux disease)    HTN (hypertension)    Mild dilation of ascending aorta (HCC)    Premature atrial contractions    PVC's (premature ventricular contractions)    Past Surgical History:  Procedure Laterality Date   CERVICAL SPINE SURGERY     Social History:  reports that he has never smoked. He has never used smokeless tobacco. He reports that he does not drink alcohol and does not use drugs.  No Known Allergies  Family History  Problem Relation Age of Onset   CAD Mother        CABG   Atrial fibrillation Father     Prior to Admission medications   Medication Sig Start Date End Date Taking? Authorizing Provider  cephALEXin (KEFLEX) 500 MG capsule Take 1 capsule (500 mg total) by mouth 2 (two) times daily for 7 days. 01/22/23 01/29/23 Yes Alvira Monday, MD  finasteride (PROSCAR) 5 MG tablet Take 1 tablet (5 mg total) by mouth daily. 01/18/23  Yes   losartan (COZAAR) 50 MG tablet Take 1 tablet (50 mg total) by mouth daily. 08/26/22  Yes Shade Flood, MD  MAGNESIUM PO Take 1 tablet by mouth at bedtime.   Yes [provider]  metoprolol succinate (TOPROL XL) 25 MG 24  hr tablet Take 1 tablet (25 mg total) by mouth daily. 11/08/22  Yes Dunn, Dayna N, PA-C  Multiple Vitamin (MULTIVITAMIN) tablet Take 1 tablet by mouth at bedtime.   Yes [provider]  Phenazopyridine HCl (AZO URINARY PAIN PO) Take 2 tablets by mouth as needed (urinary pain).   Yes [provider]  rosuvastatin (CRESTOR) 10 MG tablet Take 1 tablet (10 mg total) by mouth daily. 11/09/22 02/08/23 Yes Dunn, Dayna N, PA-C  tamsulosin (FLOMAX) 0.4 MG CAPS capsule Take 1 capsule (0.4 mg  total) by mouth daily at bedtime. Patient taking differently: Take 0.8 mg by mouth at bedtime. 09/14/22  Yes   aspirin 81 MG tablet Take 81 mg by mouth daily.   Patient not taking: Reported on 01/27/2023    [provider]  tamsulosin (FLOMAX) 0.4 MG CAPS capsule Take 2 capsules (0.8 mg total) by mouth at bedtime. 01/18/23       Physical Exam: Vitals:   01/27/23 0900 01/27/23 0915 01/27/23 0930 01/27/23 0945  BP: (!) 168/90 (!) 168/87 (!) 164/90 (!) 176/84  Pulse: 79 85 86 100  Resp: 15 (!) 23 14 19   Temp:      TempSrc:      SpO2: 98% 100% 98% 97%  Weight:       Physical Exam Vitals and nursing note reviewed.  Constitutional:      General: He is awake. He is not in acute distress.    Appearance: Normal appearance. He is ill-appearing.  HENT:     Head: Normocephalic.     Nose: No rhinorrhea.     Mouth/Throat:     Mouth: Mucous membranes are moist.  Eyes:     General: No scleral icterus.    Pupils: Pupils are equal, round, and reactive to light.  Cardiovascular:     Rate and Rhythm: Normal rate and regular rhythm.  Pulmonary:     Effort: Pulmonary effort is normal.     Breath sounds: Normal breath sounds.  Abdominal:     General: Bowel sounds are normal.     Palpations: Abdomen is soft.     Tenderness: There is abdominal tenderness in the suprapubic area. There is no right CVA tenderness, left CVA tenderness, guarding or rebound.     Hernia: No hernia is present.  Musculoskeletal:     Cervical back: Neck supple.     Right lower leg: No edema.     Left lower leg: No edema.  Skin:    General: Skin is warm and dry.  Neurological:     General: No focal deficit present.     Mental Status: He is alert and oriented to person, place, and time.  Psychiatric:        Mood and Affect: Mood normal.        Behavior: Behavior normal. Behavior is cooperative.     Data Reviewed:  Results are pending, will review when available.  Assessment and Plan: Principal Problem:    Sepsis secondary to UTI (HCC) Admit to telemetry/inpatient. Continue IV fluids. Continue cefepime 2 g every 8 hours.   Follow-up urine culture and sensitivity. Follow-up blood culture and sensitivity Follow CBC and CMP in a.m. Urology consult appreciated. -Will follow the recommendations.  Active Problems:   Essential hypertension Continue losartan 50 mg p.o. daily. Continue metoprolol succinate 25 mg p.o. daily.    GERD Antiacid, H2 blocker or PPI as needed.    Transaminitis Has been taking OTC APAP and NSAIDs. I will hold rosuvastatin  at this time. Recheck transaminases in AM. Further workup depending on results.      Advance Care Planning:   Code Status: Full Code   Consults:   Family Communication:   Severity of Illness: The appropriate patient status for this patient is OBSERVATION. Observation status is judged to be reasonable and necessary in order to provide the required intensity of service to ensure the patient's safety. The patient's presenting symptoms, physical exam findings, and initial radiographic and laboratory data in the context of their medical condition is felt to place them at decreased risk for further clinical deterioration. Furthermore, it is anticipated that the patient will be medically stable for discharge from the hospital within 2 midnights of admission.   Author: Bobette Mo, MD 01/27/2023 11:39 AM  For on call review [email or web address].   This document was prepared using Dragon voice recognition software and may contain some unintended transcription errors.

## 2023-01-27 NOTE — ED Provider Notes (Signed)
[place] EMERGENCY DEPARTMENT AT [facility] Provider Note   CSN: [id number] Arrival date & time: 01/27/23  4098     History  Chief Complaint  Patient presents with   Urinary Retention    Eugene Brown is a 57 y.o. male.  Patient with a history of hypertension, BPH, GERD presenting with concern for urinary retention.  States he was recently treated for urinary retention 2 weeks ago and had a catheter for 9 days.  He was also found to have a UTI and is currently on day 6 of 7 of Keflex for E. coli UTI.  He was seen by urology 2 days ago and catheter was removed.  He was able to void successfully and was feeling better.  However for the past 2 days he said severe sweats at night with fevers up to 103.  Has had nausea but no vomiting.  No constipation.  Since last night he is only able to urinate small amounts and feels like he is retaining urine again.  He is very uncomfortable with distended abdomen.  Denies any abdominal pain or flank pain.  Denies any testicular pain.  No chest pain or shortness of breath.  Denies any other infectious symptoms.  No cough, runny nose, sore throat, chest pain, shortness of breath, headache. History of kidney stones.  Takes aspirin but no other blood thinners.  Has been compliant with his Keflex and only has 1 dose left.  The history is provided by the patient and the spouse.       Home Medications Prior to Admission medications   Medication Sig Start Date End Date Taking? Authorizing Provider  aspirin 81 MG tablet Take 81 mg by mouth daily.      [provider]  cephALEXin (KEFLEX) 500 MG capsule Take 1 capsule (500 mg total) by mouth 2 (two) times daily for 7 days. 01/22/23 01/29/23  Alvira Monday, MD  diazepam (VALIUM) 10 MG tablet Take 1 tablet (10 mg total) by mouth 1 hour prior to MRI 11/18/22     finasteride (PROSCAR) 5 MG tablet Take 1 tablet (5 mg total) by mouth daily. 01/18/23     losartan (COZAAR) 50 MG tablet Take 1  tablet (50 mg total) by mouth daily. 08/26/22   Shade Flood, MD  metoprolol succinate (TOPROL XL) 25 MG 24 hr tablet Take 1 tablet (25 mg total) by mouth daily. 11/08/22   Dunn, Tacey Ruiz, PA-C  Multiple Vitamin (MULTIVITAMIN) tablet Take 1 tablet by mouth daily.      [provider]  Omega-3 Fatty Acids (FISH OIL) 1200 MG CAPS Take 1 capsule by mouth 2 (two) times daily.      [provider]  rosuvastatin (CRESTOR) 10 MG tablet Take 1 tablet (10 mg total) by mouth daily. 11/09/22 02/08/23  Dunn, Tacey Ruiz, PA-C  tamsulosin (FLOMAX) 0.4 MG CAPS capsule Take 1 capsule (0.4 mg total) by mouth daily at bedtime. 09/14/22     tamsulosin (FLOMAX) 0.4 MG CAPS capsule Take 2 capsules (0.8 mg total) by mouth at bedtime. 01/18/23         Allergies    Patient has no known allergies.    Review of Systems   Review of Systems  Constitutional:  Positive for fever. Negative for activity change and appetite change.  HENT:  Negative for congestion.   Respiratory:  Negative for cough, chest tightness and shortness of breath.   Gastrointestinal:  Positive for nausea. Negative for abdominal pain and vomiting.  Genitourinary:  Positive for decreased urine volume, difficulty urinating and hematuria. Negative for testicular pain.  Musculoskeletal:  Negative for arthralgias, back pain and myalgias.  Skin:  Negative for rash.  Neurological:  Negative for weakness and headaches.   all other systems are negative except as noted in the HPI and PMH.    Physical Exam Updated Vital Signs BP (!) 144/94   Pulse 69   Temp 97.9 F (36.6 C) (Oral)   Resp (!) 26   Wt 81.6 kg   SpO2 100%   BMI 25.10 kg/m  Physical Exam Vitals and nursing note reviewed.  Constitutional:      General: He is in acute distress.     Appearance: He is well-developed.     Comments: Uncomfortable, pacing around the room  HENT:     Head: Normocephalic and atraumatic.     Mouth/Throat:     Pharynx: No oropharyngeal exudate.   Eyes:     Conjunctiva/sclera: Conjunctivae normal.     Pupils: Pupils are equal, round, and reactive to light.  Neck:     Comments: No meningismus. Cardiovascular:     Rate and Rhythm: Normal rate and regular rhythm.     Heart sounds: Normal heart sounds. No murmur heard. Pulmonary:     Effort: Pulmonary effort is normal. No respiratory distress.     Breath sounds: Normal breath sounds.  Abdominal:     Palpations: Abdomen is soft.     Tenderness: There is abdominal tenderness. There is guarding. There is no rebound.     Comments: Distended abdomen, voluntary guarding, palpable bladder  Musculoskeletal:        General: No tenderness. Normal range of motion.     Cervical back: Normal range of motion and neck supple.     Comments: No CVA tenderness  Skin:    General: Skin is warm.  Neurological:     Mental Status: He is alert and oriented to person, place, and time.     Cranial Nerves: No cranial nerve deficit.     Motor: No abnormal muscle tone.     Coordination: Coordination normal.     Comments:  5/5 strength throughout. CN 2-12 intact.Equal grip strength.   Psychiatric:        Behavior: Behavior normal.     ED Results / Procedures / Treatments   Labs (all labs ordered are listed, but only abnormal results are displayed) Labs Reviewed  CULTURE, BLOOD (ROUTINE X 2)  CULTURE, BLOOD (ROUTINE X 2)  URINE CULTURE  SARS CORONAVIRUS 2 BY RT PCR  URINALYSIS, ROUTINE W REFLEX MICROSCOPIC  LACTIC ACID, PLASMA  LACTIC ACID, PLASMA  CBC WITH DIFFERENTIAL/PLATELET  COMPREHENSIVE METABOLIC PANEL  LIPASE, BLOOD    EKG None  Radiology No results found.  Procedures Procedures    Medications Ordered in ED Medications  lactated ringers bolus 1,000 mL (has no administration in time range)  ondansetron (ZOFRAN) injection 4 mg (has no administration in time range)    ED Course/ Medical Decision Making/ A&P                                 Medical Decision Making Amount  and/or Complexity of Data Reviewed Labs: ordered. Decision-making details documented in ED Course. Radiology: ordered and independent interpretation performed. Decision-making details documented in ED Course. ECG/medicine tests: ordered and independent interpretation performed. Decision-making details documented in ED Course.  Risk Prescription drug management.  Recent urinary retention with UTI, passed voiding trial 2 days ago.  Now with chills, fever, sweats and urinary retention again since last night.  Afebrile currently with stable vital signs.  However appears uncomfortable with distended abdomen and likely urinary retention.  Bladder scan 362.  Bladder does feel larger than that.  He is agreeable to replace catheter.  Will check labs including lactate and blood cultures. COVID swab to be obtained given fever but denies any cough, runny nose, sore throat, chest pain or shortness of breath.  Urine culture from August 31 to grew E. coli that was sensitive to Keflex  Patient will be hydrated.  He declines pain medication at this time.  Will check labs with blood cultures, lactate, urine and urine culture.  Will also obtain CT scan to rule out pyelonephritis or infected kidney stone or abscess given length of illness and persistent fevers and diaphoresis.  Will replace Foley catheter.  Labs and imaging pending at shift change. Will rule out infected kidney stone versus abscess or pyelonephritis. Patient does not appear to be toxic or septic but is uncomfortable due to retention.  Care to be transferred at shift change to Dr. Wallace Cullens.          Final Clinical Impression(s) / ED Diagnoses Final diagnoses:  None    Rx / DC Orders ED Discharge Orders     None         Lenora Gomes, Jeannett Senior, MD 01/27/23 [phone]

## 2023-01-27 NOTE — Progress Notes (Signed)
Plan of Care Note for accepted transfer   Patient: Eugene Brown MRN: [id number]   DOA: 01/27/2023  [facility]ility [facility]: [facility] Requesting Provider: Edwin Dada, DO. Reason for transfer: Sepsis due to UTI. Facility course:  56 year old male with recent urinary retention and UTI requiring Foley catheterization.  He was started on Keflex.  He passed a voiding trial 2 days ago, but went to the emergency department this morning with complaints of night sweats, chills, fever and urinary retention since yesterday evening.  He met sepsis criteria.  He was given IV fluids and cefepime.  Urology consulted (Dr. Pete Glatter) and primary urologist is Dr. Cardell Peach.  Lab work: SARS Coronavirus 2 by RT PCR (hospital order, performed in [facility] hospital lab) *cepheid single result test* [[id number]]   Collected: 01/27/23 0745   Updated: 01/27/23 0827   Specimen Type: Nasal Swab    SARS Coronavirus 2 by RT PCR NEGATIVE  Lactic acid, plasma [[id number]] (Abnormal)   Collected: 01/27/23 0745   Updated: 01/27/23 0825   Specimen Type: Blood    Lactic Acid, Venous 2.2 High Panic  mmol/L  Comprehensive metabolic panel [[id number]] (Abnormal)   Collected: 01/27/23 0745   Updated: 01/27/23 0824   Specimen Type: Blood    Sodium 140 mmol/L   Potassium 4.4 mmol/L   Chloride 105 mmol/L   CO2 25 mmol/L   Glucose, Bld 114 High  mg/dL   BUN 16 mg/dL   Creatinine, Ser 6.29 mg/dL   Calcium 9.2 mg/dL   Total Protein 7.1 g/dL   Albumin 4.1 g/dL   AST 73 High  U/L   ALT 134 High  U/L   Alkaline Phosphatase 101 U/L   Total Bilirubin 0.8 mg/dL   GFR, Estimated >52 mL/min   Anion gap 10  Lipase, blood [[id number]]   Collected: 01/27/23 0745   Updated: 01/27/23 0824   Specimen Type: Blood    Lipase 15 U/L  CBC with Differential [[id number]] (Abnormal)   Collected: 01/27/23 0745   Updated: 01/27/23 0755   Specimen Type: Blood    WBC 11.0 High  K/uL   RBC 4.84 MIL/uL   Hemoglobin 14.0 g/dL   HCT 40.3 %    MCV 47.4 fL   MCH 28.9 pg   MCHC 34.0 g/dL   RDW 25.9 %   Platelets 216 K/uL   nRBC 0.0 %   Neutrophils Relative % 79 %   Neutro Abs 8.6 High  K/uL   Lymphocytes Relative 6 %   Lymphs Abs 0.7 K/uL   Monocytes Relative 14 %   Monocytes Absolute 1.6 High  K/uL   Eosinophils Relative 0 %   Eosinophils Absolute 0.0 K/uL   Basophils Relative 0 %   Basophils Absolute 0.0 K/uL   Immature Granulocytes 1 %   Abs Immature Granulocytes 0.06 K/uL  Urinalysis, Routine w reflex microscopic -Urine, Clean Catch [[id number]] (Abnormal)   Collected: 01/27/23 0704   Updated: 01/27/23 0729   Specimen Source: Urine, Catheterized    Color, Urine YELLOW   APPearance HAZY Abnormal    Specific Gravity, Urine 1.020   pH 6.0   Glucose, UA NEGATIVE mg/dL   Hgb urine dipstick LARGE Abnormal    Bilirubin Urine NEGATIVE   Ketones, ur NEGATIVE mg/dL   Protein, ur TRACE Abnormal  mg/dL   Nitrite NEGATIVE   Leukocytes,Ua MODERATE Abnormal    RBC / HPF >50 RBC/hpf   WBC, UA >50 WBC/hpf   Bacteria, UA RARE Abnormal    Squamous Epithelial / HPF  0-5 /HPF   Mucus PRESENT  Urine Culture [[id number]]   Collected: 01/27/23 0703   Updated: 01/27/23 0710   Specimen Source: Urine, Catheterized    Imaging: CHEST - 2 VIEW   COMPARISON:  CT Abdomen and Pelvis 0730 hours today. Chest radiographs 07/30/2008.   FINDINGS: Lung volumes and mediastinal contours are normal. Visualized tracheal air column is within normal limits. Right lung calcified granuloma is less apparent since 2010. Otherwise both lungs appear clear. No pneumothorax or pleural effusion. Paucity of bowel gas in the upper abdomen. Previous cervical ACDF. No acute osseous abnormality identified.   IMPRESSION: No acute cardiopulmonary abnormality.   Electronically Signed   By: Odessa Fleming M.D.   On: 01/27/2023 07:59  IMPRESSION: 1. Bladder decompressed by Foley catheter. Indistinct Prostatomegaly. Punctate right nephrolithiasis with no  evidence of renal obstruction. Consider prostate related bladder obstruction.   2. No other acute or inflammatory process identified in the noncontrast abdomen or pelvis. Retained stool.  Aortic Atherosclerosis (ICD10-I70.0).   Electronically Signed   By: Odessa Fleming M.D.   On: 01/27/2023 07:55  Plan of care: The patient is accepted for admission to Telemetry unit, at [facility].  Author: Bobette Mo, MD 01/27/2023  Check [email or web address] for on-call coverage.  Nursing staff, Please call [facility] Admits & Consults System-Wide number on Amion as soon as patient's arrival, so appropriate admitting provider can evaluate the pt.

## 2023-01-27 NOTE — Consult Note (Addendum)
Urology Consult Note   Requesting Attending Physician:  Bobette Mo, MD Service Providing Consult: Urology  Consulting Attending: Dr. Pete Glatter   Reason for Consult: Urinary retention, sepsis  HPI: Eugene Brown is seen in consultation for reasons noted above at the request of Bobette Mo, MD. patient had successful voiding trial in clinic a few days ago and shortly thereafter began to develop cyclical nocturnal fevers, chills, experience urinary frequency and urgency, and became profoundly diaphoretic.  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  He was sent to [facility] for higher level of care.  ------------------  Assessment/Plan:  #Elevated PSA:  -He was found to have a PSA of 13.39 on 08/20/2021. Repeat PSA on 08/24/2021 was 10.3. He had no prior PSA evaluation. PSA density 0.07 with prostate volume 129cc.  -He had 13% chance of high-grade prostate cancer and 22% chance of low-grade prostate cancer on PCPT risk assessment.  -S/p TRUS biopsy 09/15/2021 with TRUS volume 129 cc and BPH in all cores.  -PSA on 03/25/2022 was 9.5 and 10.6 on 09/30/2022.  -Prostate MRI 11/20/2022 with PI-RADS category 1, very low likelihood of cancer. Prostate volume 120 cc.    #BPH/LUTS #Urinary Retention  IPSS score is 7, quality-of-life 1 in 09/2022. This has improved on tamsulosin 0.4 mg.  -Started Finasteride 11 days ago. Continue both of these.  -Urinary retention 12/2022 requiring Foley catheter placement for bladder scan 500 mL. -Passed void trial on 01/25/2023.  -Presented to emergency department with complaint of urinary retention on 9//24.  Of note, bladder scan only reflected 362 mL, which would not represent obstructive uropathy. -CT A/P collected 9/4 repeats known prostatomegaly.  No hydronephrosis noted. Foley placed 9/4.  Draining clear yellow urine  #Probable Pyelonephritis #Sepsis -Mild lactic acidosis which has  since resolved -Recommend keep Foley catheter in place through discharge.  Patient may transition to leg bag  Patient has an extremely large prostate and has been considering/postponing surgical intervention.  If I were to speculate he probably had a minor cystitis brewing due to his most recent Foley catheter, the catheter was removed, his small amount of urinary function was obstructed due to infection, and obstructive uropathy allowed this to reflux up to his kidneys, presenting as pyelonephritis.  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  I have already contacted Dr. Cardell Peach who is now forwarding over his information to Dr. Richardo Hanks of [facility] urology.  No surgical intervention indicated at this time.  Urology will follow along peripherally.   Case and plan discussed with Dr. Cardell Peach and Dr. Pete Glatter  Agree with assessment and plan.  Milderd Meager, MD [facility] [phone]   Past Medical History: Past Medical History:  Diagnosis Date   GERD (gastroesophageal reflux disease)    HTN (hypertension)    Mild dilation of ascending aorta (HCC)    Premature atrial contractions    PVC's (premature ventricular contractions)     Past Surgical History:  Past Surgical History:  Procedure Laterality Date   CERVICAL SPINE SURGERY      Medication: Current Facility-Administered Medications  Medication Dose Route Frequency Provider Last Rate Last Admin   0.9 %  sodium chloride infusion   Intravenous PRN Franne Forts, DO   Stopped at 01/27/23 5784   acetaminophen (TYLENOL) tablet 650 mg  650 mg Oral Q6H PRN Bobette Mo, MD   650 mg at  01/27/23 1217   ceFEPIme (MAXIPIME) 2 g in sodium chloride 0.9 % 100 mL IVPB  2 g Intravenous Q8H Rumbarger, Rachel L, RPH       HYDROmorphone (DILAUDID) injection 0.5 mg  0.5 mg Intravenous Q2H PRN Bobette Mo, MD       ketorolac (TORADOL) 30 MG/ML injection 30 mg  30 mg  Intravenous Q8H PRN Bobette Mo, MD       lactated ringers bolus 500 mL  500 mL Intravenous Once Bobette Mo, MD       lactated ringers infusion  150 mL/hr Intravenous Continuous Bobette Mo, MD 150 mL/hr at 01/27/23 1149 150 mL/hr at 01/27/23 1149   ondansetron (ZOFRAN) injection 4 mg  4 mg Intravenous Once Rancour, Jeannett Senior, MD       ondansetron [facility]) tablet 4 mg  4 mg Oral Q6H PRN Bobette Mo, MD       Or   ondansetron [facility]) injection 4 mg  4 mg Intravenous Q6H PRN Bobette Mo, MD       oxyCODONE (Oxy IR/ROXICODONE) immediate release tablet 5 mg  5 mg Oral Q6H PRN Bobette Mo, MD        Allergies: No Known Allergies  Social History: Social History   Tobacco Use   Smoking status: Never   Smokeless tobacco: Never   Tobacco comments:    no tobacco   Substance Use Topics   Alcohol use: No   Drug use: No    Family History Family History  Problem Relation Age of Onset   CAD Mother        CABG   Atrial fibrillation Father     Review of Systems  Constitutional:  Positive for chills, diaphoresis, fever and malaise/fatigue.  Genitourinary:  Positive for dysuria, frequency and urgency.     Objective   Vital signs in last 24 hours: BP 136/79 (BP Location: Right Arm)   Pulse 85   Temp (!) 102.6 F (39.2 C) (Oral)   Resp 18   Ht 5\' 10"  (1.778 m)   Wt 83.5 kg   SpO2 96%   BMI 26.41 kg/m   Physical Exam General: NAD, A&O, resting, appropriate HEENT: [place]/AT Pulmonary: Normal work of breathing Cardiovascular: RRR, no cyanosis Abdomen: Soft, NTTP, nondistended GU: Foley catheter in place draining clear yellow urine. Neuro: Appropriate, no focal neurological deficits  Most Recent Labs: Lab Results  Component Value Date   WBC 11.0 (H) 01/27/2023   HGB 14.0 01/27/2023   HCT 41.2 01/27/2023   PLT 216 01/27/2023    Lab Results  Component Value Date   NA 140 01/27/2023   K 4.4 01/27/2023   CL 105 01/27/2023    CO2 25 01/27/2023   BUN 16 01/27/2023   CREATININE 1.12 01/27/2023   CALCIUM 9.2 01/27/2023    No results found for: "INR", "APTT"   Urine Culture: @[id number] (laburin,org,[id number])@   IMAGING: DG Chest 2 View  Result Date: 01/27/2023 CLINICAL DATA:  57 year old male with fever and urinary retention. EXAM: CHEST - 2 VIEW COMPARISON:  CT Abdomen and Pelvis 0730 hours today. Chest radiographs 07/30/2008. FINDINGS: Lung volumes and mediastinal contours are normal. Visualized tracheal air column is within normal limits. Right lung calcified granuloma is less apparent since 2010. Otherwise both lungs appear clear. No pneumothorax or pleural effusion. Paucity of bowel gas in the upper abdomen. Previous cervical ACDF. No acute osseous abnormality identified. IMPRESSION: No acute cardiopulmonary abnormality. Electronically Signed   By: Odessa Fleming  M.D.   On: 01/27/2023 07:59   CT Renal Stone Study  Result Date: 01/27/2023 CLINICAL DATA:  57 year old male with abdomen and flank pain. Fever and chills. Urinary retention. History of kidney stones. EXAM: CT ABDOMEN AND PELVIS WITHOUT CONTRAST TECHNIQUE: Multidetector CT imaging of the abdomen and pelvis was performed following the standard protocol without IV contrast. RADIATION DOSE REDUCTION: This exam was performed according to the departmental dose-optimization program which includes automated exposure control, adjustment of the mA and/or kV according to patient size and/or use of iterative reconstruction technique. COMPARISON:  None. FINDINGS: Lower chest: Negative. Hepatobiliary: Small but circumscribed low-density area in the right hepatic lobe series 2, image 10 has simple fluid density and is most compatible with benign cyst (no follow-up imaging recommended). Otherwise negative visible noncontrast liver and gallbladder. Pancreas: Negative. Spleen: Negative. Adrenals/Urinary Tract: Normal adrenal glands. Punctate right lower pole nephrolithiasis on  coronal image 37. No hydronephrosis. Symmetric pararenal space stranding, nonspecific. No hydro. Bladder decompressed by Foley catheter. Stomach/Bowel: Large bowel redundancy and retained stool. Normal retrocecal appendix. No large bowel inflammation. Nondilated small bowel, stomach. Negative duodenum. No free air or free fluid. Vascular/Lymphatic: Aortoiliac calcified atherosclerosis. Normal caliber abdominal aorta. Vascular patency is not evaluated in the absence of IV contrast. No lymphadenopathy identified. Reproductive: Prostatomegaly is indistinct, urethral catheter in place. Other: No pelvis free fluid. Musculoskeletal: Negative; occasional benign pelvic bone islands such as series 2, image 79. IMPRESSION: 1. Bladder decompressed by Foley catheter. Indistinct Prostatomegaly. Punctate right nephrolithiasis with no evidence of renal obstruction. Consider prostate related bladder obstruction. 2. No other acute or inflammatory process identified in the noncontrast abdomen or pelvis. Retained stool.  Aortic Atherosclerosis (ICD10-I70.0). Electronically Signed   By: Odessa Fleming M.D.   On: 01/27/2023 07:55    ------  Elmon Kirschner, NP Pager: [phone]   Please contact the urology consult pager with any further questions/concerns.

## 2023-01-27 NOTE — ED Triage Notes (Signed)
Pt in with urinary retention, reported fevers of 103 at home. Recent visits for same, currently on Keflex (day 6 of abx). Pt states he has been unable to void since before bedtime last night. Denies any blood in urine

## 2023-01-27 NOTE — Progress Notes (Signed)
Pharmacy Antibiotic Note  Eugene Brown is a 57 y.o. male admitted on 01/27/2023 with sepsis and UTI.  Pharmacy has been consulted for cefepime dosing. Pt is afebrile but WBC and lactic acid are elevated. Pt was keflex PTA.   Plan: Cefepime 2g IV Q8H F/u renal fxn, C&S, clinical status De-escalate as able  Weight: 81.6 kg (180 lb)  Temp (24hrs), Avg:98.4 F (36.9 C), Min:97.9 F (36.6 C), Max:98.8 F (37.1 C)  Recent Labs  Lab 01/27/23 0745  WBC 11.0*  CREATININE 1.12  LATICACIDVEN 2.2*    Estimated Creatinine Clearance: 75.6 mL/min (by C-G formula based on SCr of 1.12 mg/dL).    No Known Allergies  Antimicrobials this admission: Cefepime 9/5>>  Dose adjustments this admission: N/A  Microbiology results: Pending  Thank you for allowing pharmacy to be a part of this patient's care.  Melva Faux, Drake Leach 01/27/2023 8:57 AM

## 2023-01-27 NOTE — ED Notes (Signed)
Bladder scanned- 362 mL

## 2023-01-28 DIAGNOSIS — A419 Sepsis, unspecified organism: Secondary | ICD-10-CM | POA: Diagnosis not present

## 2023-01-28 DIAGNOSIS — N390 Urinary tract infection, site not specified: Secondary | ICD-10-CM | POA: Diagnosis not present

## 2023-01-28 LAB — CBC WITH DIFFERENTIAL/PLATELET
Abs Immature Granulocytes: 0.09 10*3/uL — ABNORMAL HIGH (ref 0.00–0.07)
Basophils Absolute: 0 10*3/uL (ref 0.0–0.1)
Basophils Relative: 0 %
Eosinophils Absolute: 0 10*3/uL (ref 0.0–0.5)
Eosinophils Relative: 0 %
HCT: 35.9 % — ABNORMAL LOW (ref 39.0–52.0)
Hemoglobin: 12.1 g/dL — ABNORMAL LOW (ref 13.0–17.0)
Immature Granulocytes: 1 %
Lymphocytes Relative: 8 %
Lymphs Abs: 1 10*3/uL (ref 0.7–4.0)
MCH: 29.6 pg (ref 26.0–34.0)
MCHC: 33.7 g/dL (ref 30.0–36.0)
MCV: 87.8 fL (ref 80.0–100.0)
Monocytes Absolute: 1.4 10*3/uL — ABNORMAL HIGH (ref 0.1–1.0)
Monocytes Relative: 11 %
Neutro Abs: 9.9 10*3/uL — ABNORMAL HIGH (ref 1.7–7.7)
Neutrophils Relative %: 80 %
Platelets: 201 10*3/uL (ref 150–400)
RBC: 4.09 MIL/uL — ABNORMAL LOW (ref 4.22–5.81)
RDW: 12 % (ref 11.5–15.5)
WBC: 12.4 10*3/uL — ABNORMAL HIGH (ref 4.0–10.5)
nRBC: 0 % (ref 0.0–0.2)

## 2023-01-28 LAB — COMPREHENSIVE METABOLIC PANEL
ALT: 134 U/L — ABNORMAL HIGH (ref 0–44)
AST: 73 U/L — ABNORMAL HIGH (ref 15–41)
Albumin: 2.9 g/dL — ABNORMAL LOW (ref 3.5–5.0)
Alkaline Phosphatase: 101 U/L (ref 38–126)
Anion gap: 10 (ref 5–15)
BUN: 14 mg/dL (ref 6–20)
CO2: 20 mmol/L — ABNORMAL LOW (ref 22–32)
Calcium: 8.2 mg/dL — ABNORMAL LOW (ref 8.9–10.3)
Chloride: 104 mmol/L (ref 98–111)
Creatinine, Ser: 1.15 mg/dL (ref 0.61–1.24)
GFR, Estimated: >60 mL/min (ref >60)
Glucose, Bld: 116 mg/dL — ABNORMAL HIGH (ref 70–99)
Potassium: 3.5 mmol/L (ref 3.5–5.1)
Sodium: 134 mmol/L — ABNORMAL LOW (ref 135–145)
Total Bilirubin: 0.8 mg/dL (ref 0.3–1.2)
Total Protein: 5.8 g/dL — ABNORMAL LOW (ref 6.5–8.1)

## 2023-01-28 LAB — URINE CULTURE: Culture: <10000 — AB

## 2023-01-28 LAB — HIV ANTIBODY (ROUTINE TESTING W REFLEX): HIV Screen 4th Generation wRfx: NONREACTIVE

## 2023-01-28 MED ORDER — HYOSCYAMINE SULFATE 0.125 MG SL SUBL
0.1250 mg | SUBLINGUAL_TABLET | SUBLINGUAL | Status: DC | PRN
  Administered 2023-01-28 – 2023-01-30 (×6): 0.125 mg via SUBLINGUAL
  Filled 2023-01-28 (×8): qty 1

## 2023-01-28 MED ORDER — PHENAZOPYRIDINE HCL 100 MG PO TABS
100.0000 mg | ORAL_TABLET | Freq: Once | ORAL | Status: AC
  Administered 2023-01-28: 100 mg via ORAL
  Filled 2023-01-28: qty 1

## 2023-01-28 MED ORDER — CHLORHEXIDINE GLUCONATE CLOTH 2 % EX PADS
6.0000 | MEDICATED_PAD | Freq: Every day | CUTANEOUS | Status: DC
  Administered 2023-01-28 – 2023-01-31 (×4): 6 via TOPICAL

## 2023-01-28 MED ORDER — HYOSCYAMINE SULFATE 0.125 MG PO TBDP
0.1250 mg | ORAL_TABLET | ORAL | Status: DC | PRN
  Administered 2023-01-28 (×2): 0.125 mg via SUBLINGUAL
  Filled 2023-01-28 (×4): qty 1

## 2023-01-28 NOTE — Progress Notes (Signed)
Subjective: Worse fevering overnight- Tmax 103.1. Wife at bedside. Reviewed case again  Objective: Vital signs in last 24 hours: Temp:  [98.2 F (36.8 C)-103.1 F (39.5 C)] 98.2 F (36.8 C) (09/06 0811) Pulse Rate:  [75-100] 75 (09/06 0811) Resp:  [14-20] 19 (09/06 0451) BP: (121-176)/(72-90) 142/72 (09/06 0811) SpO2:  [95 %-100 %] 95 % (09/06 0811) Weight:  [83.5 kg] 83.5 kg (09/05 1100)  Assessment/Plan: #Elevated PSA:  -He was found to have a PSA of 13.39 on 08/20/2021. Repeat PSA on 08/24/2021 was 10.3. He had no prior PSA evaluation. PSA density 0.07 with prostate volume 129cc.  -He had 13% chance of high-grade prostate cancer and 22% chance of low-grade prostate cancer on PCPT risk assessment.  -S/p TRUS biopsy 09/15/2021 with TRUS volume 129 cc and BPH in all cores.  -PSA on 03/25/2022 was 9.5 and 10.6 on 09/30/2022.  -Prostate MRI 11/20/2022 with PI-RADS category 1, very low likelihood of cancer. Prostate volume 120 cc.    #BPH/LUTS #Urinary Retention  IPSS score is 7, quality-of-life 1 in 09/2022. This has improved on tamsulosin 0.4 mg.  -Started Finasteride 11 days ago. Continue both of these.  -Urinary retention 12/2022 requiring Foley catheter placement for bladder scan 500 mL. -Passed void trial on 01/25/2023.  -Presented to emergency department with complaint of urinary retention on 9//24.  Of note, bladder scan only reflected 362 mL, which would not represent obstructive uropathy. -CT A/P collected 9/4 repeats known severe prostatomegaly.  No hydronephrosis noted. Foley placed 9/4.  Draining clear yellow urine. Keep on discharge.   #Bladder spasm -hyoscyamine 0.125/Q4   #Probable Pyelonephritis #Sepsis -Mild lactic acidosis which has since resolved -UC w/ insignificant growth -worsening cyclical fevers   Intake/Output from previous day: 09/05 0701 - 09/06 0700 In: 1268.2 [P.O.:168; I.V.:0.2; IV Piggyback:1100] Out: 3175 [Urine:3175]  Intake/Output this  shift: No intake/output data recorded.  Physical Exam:  General: Alert and oriented CV: No cyanosis Lungs: equal chest rise Gu: foley in place draining clear yellow urine  Lab Results: Recent Labs    01/27/23 0745 01/28/23 0532  HGB 14.0 12.1*  HCT 41.2 35.9*   BMET Recent Labs    01/27/23 0745 01/28/23 0532  NA 140 134*  K 4.4 3.5  CL 105 104  CO2 25 20*  GLUCOSE 114* 116*  BUN 16 14  CREATININE 1.12 1.15  CALCIUM 9.2 8.2*     Studies/Results: DG Chest 2 View  Result Date: 01/27/2023 CLINICAL DATA:  57 year old male with fever and urinary retention. EXAM: CHEST - 2 VIEW COMPARISON:  CT Abdomen and Pelvis 0730 hours today. Chest radiographs 07/30/2008. FINDINGS: Lung volumes and mediastinal contours are normal. Visualized tracheal air column is within normal limits. Right lung calcified granuloma is less apparent since 2010. Otherwise both lungs appear clear. No pneumothorax or pleural effusion. Paucity of bowel gas in the upper abdomen. Previous cervical ACDF. No acute osseous abnormality identified. IMPRESSION: No acute cardiopulmonary abnormality. Electronically Signed   By: Odessa Fleming M.D.   On: 01/27/2023 07:59   CT Renal Stone Study  Result Date: 01/27/2023 CLINICAL DATA:  57 year old male with abdomen and flank pain. Fever and chills. Urinary retention. History of kidney stones. EXAM: CT ABDOMEN AND PELVIS WITHOUT CONTRAST TECHNIQUE: Multidetector CT imaging of the abdomen and pelvis was performed following the standard protocol without IV contrast. RADIATION DOSE REDUCTION: This exam was performed according to the departmental dose-optimization program which includes automated exposure control, adjustment of the mA and/or kV according  to patient size and/or use of iterative reconstruction technique. COMPARISON:  None. FINDINGS: Lower chest: Negative. Hepatobiliary: Small but circumscribed low-density area in the right hepatic lobe series 2, image 10 has simple fluid density  and is most compatible with benign cyst (no follow-up imaging recommended). Otherwise negative visible noncontrast liver and gallbladder. Pancreas: Negative. Spleen: Negative. Adrenals/Urinary Tract: Normal adrenal glands. Punctate right lower pole nephrolithiasis on coronal image 37. No hydronephrosis. Symmetric pararenal space stranding, nonspecific. No hydro. Bladder decompressed by Foley catheter. Stomach/Bowel: Large bowel redundancy and retained stool. Normal retrocecal appendix. No large bowel inflammation. Nondilated small bowel, stomach. Negative duodenum. No free air or free fluid. Vascular/Lymphatic: Aortoiliac calcified atherosclerosis. Normal caliber abdominal aorta. Vascular patency is not evaluated in the absence of IV contrast. No lymphadenopathy identified. Reproductive: Prostatomegaly is indistinct, urethral catheter in place. Other: No pelvis free fluid. Musculoskeletal: Negative; occasional benign pelvic bone islands such as series 2, image 79. IMPRESSION: 1. Bladder decompressed by Foley catheter. Indistinct Prostatomegaly. Punctate right nephrolithiasis with no evidence of renal obstruction. Consider prostate related bladder obstruction. 2. No other acute or inflammatory process identified in the noncontrast abdomen or pelvis. Retained stool.  Aortic Atherosclerosis (ICD10-I70.0). Electronically Signed   By: Odessa Fleming M.D.   On: 01/27/2023 07:55      LOS: 1 day   Elmon Kirschner, NP [facility] Pager: [phone]  01/28/2023, 9:26 AM

## 2023-01-28 NOTE — Progress Notes (Signed)
PROGRESS NOTE    KHAMAR STANDARD  WUJ:[id number] DOB: 1965-07-08 DOA: 01/27/2023 PCP: Shade Flood, MD   Brief Narrative:  Eugene Brown is a 58 y.o. male with medical history significant of GERD, hypertension, mild dilation of the ascending aorta, PACs, PVCs who recently had an episode of urinary retention requiring Foley catheter insertion, this was followed by an E. coli UTI that was sensitive to cephalexin.  He spent 9 days with a Foley catheter inserted.   This was discontinued 2 days prior to admission without immediate complication. Acutely febrile the morning of admission with worsening pelvic pressure/pain  Assessment & Plan:   Principal Problem:   Sepsis secondary to UTI [facility]) Active Problems:   Essential hypertension   GERD   Transaminitis   Sepsis secondary to UTI (HCC) In the setting of leukocytosis, fever, and noted source given UTI Continue cefepime 2 g every 8 hours.   *Prior urine culture shows insignificant growth - follow repeat in house cultures Urology consult appreciated.   Essential hypertension Continue losartan 50 mg p.o. daily. Continue metoprolol succinate 25 mg p.o. daily.   GERD Antiacid, H2 blocker or PPI as needed.   Elevated liver panel, stable Has been taking OTC APAP and NSAIDs. Hold rosuvastatin  Labs stable, continue to follow  DVT prophylaxis: SCDs Start: 01/27/23 1202 Code Status:   Code Status: Full Code Family Communication: None present  Status is: Inpt  Dispo: The patient is from: home              Anticipated d/c is to: home              Anticipated d/c date is: 24-48h              Patient currently NOT medically stable for discharge  Consultants:  Urology  Procedures:  none  Antimicrobials:  Cefepime   Subjective: Continues to complain of fevers overnight. ROS otherwise unremarkable.  Objective: Vitals:   01/27/23 2200 01/28/23 0006 01/28/23 0121 01/28/23 0451  BP:  (!) 157/90  (!) 162/88  Pulse:   98  79  Resp:  19  19  Temp: 99.9 F (37.7 C) (!) 103.1 F (39.5 C) (!) 100.5 F (38.1 C) 99.1 F (37.3 C)  TempSrc: Oral  Oral   SpO2:  98%  99%  Weight:      Height:        Intake/Output Summary (Last 24 hours) at 01/28/2023 0811 Last data filed at 01/28/2023 0700 Gross per 24 hour  Intake 1268.24 ml  Output 3175 ml  Net -1906.76 ml   Filed Weights   01/27/23 0630 01/27/23 1100  Weight: 81.6 kg 83.5 kg    Examination:  General exam: Appears calm and comfortable  Respiratory system: Clear to auscultation. Respiratory effort normal. Cardiovascular system: S1 & S2 heard, RRR. No JVD, murmurs, rubs, gallops or clicks. No pedal edema. GI/GU: Nondistended, soft and nontender. Foley catheter intact Central nervous system: Alert and oriented. No focal neurological deficits. Extremities: Symmetric 5 x 5 power. Skin: No rashes, lesions or ulcers  Data Reviewed: I have personally reviewed following labs and imaging studies  CBC: Recent Labs  Lab 01/27/23 0745 01/28/23 0532  WBC 11.0* 12.4*  NEUTROABS 8.6* 9.9*  HGB 14.0 12.1*  HCT 41.2 35.9*  MCV 85.1 87.8  PLT 216 201   Basic Metabolic Panel: Recent Labs  Lab 01/27/23 0745 01/28/23 0532  NA 140 134*  K 4.4 3.5  CL 105 104  CO2 25  20*  GLUCOSE 114* 116*  BUN 16 14  CREATININE 1.12 1.15  CALCIUM 9.2 8.2*   GFR: Estimated Creatinine Clearance: 71.4 mL/min (by C-G formula based on SCr of 1.15 mg/dL). Liver Function Tests: Recent Labs  Lab 01/27/23 0745 01/28/23 0532  AST 73* 73*  ALT 134* 134*  ALKPHOS 101 101  BILITOT 0.8 0.8  PROT 7.1 5.8*  ALBUMIN 4.1 2.9*   Recent Labs  Lab 01/27/23 0745  LIPASE 15   Sepsis Labs: Recent Labs  Lab 01/27/23 0745 01/27/23 0953  LATICACIDVEN 2.2* 1.1   Recent Results (from the past 240 hour(s))  Urine Culture     Status: Abnormal   Collection Time: 01/22/23 11:36 AM   Specimen: Urine, Random  Result Value Ref Range Status   Specimen Description   Final     URINE, RANDOM Performed at [facility], [address] [place], [place] [address]    Special Requests   Final    NONE Reflexed from [id number] Performed at [facility], [address] [place], [place] [address]    Culture >=100,000 COLONIES/mL ESCHERICHIA COLI (A)  Final   Report Status 01/24/2023 FINAL  Final   Organism ID, Bacteria ESCHERICHIA COLI (A)  Final      Susceptibility   Escherichia coli - MIC*    AMPICILLIN >=32 RESISTANT Resistant     CEFAZOLIN <=4 SENSITIVE Sensitive     CEFEPIME <=0.12 SENSITIVE Sensitive     CEFTRIAXONE <=0.25 SENSITIVE Sensitive     CIPROFLOXACIN <=0.25 SENSITIVE Sensitive     GENTAMICIN <=1 SENSITIVE Sensitive     IMIPENEM <=0.25 SENSITIVE Sensitive     NITROFURANTOIN <=16 SENSITIVE Sensitive     TRIMETH/SULFA <=20 SENSITIVE Sensitive     AMPICILLIN/SULBACTAM 8 SENSITIVE Sensitive     PIP/TAZO <=4 SENSITIVE Sensitive     * >=100,000 COLONIES/mL ESCHERICHIA COLI  SARS Coronavirus 2 by RT PCR (hospital order, performed in [facility] hospital lab) *cepheid single result test*     Status: None   Collection Time: 01/27/23  7:45 AM   Specimen: Nasal Swab  Result Value Ref Range Status   SARS Coronavirus 2 by RT PCR NEGATIVE NEGATIVE Final    Comment: (NOTE) SARS-CoV-2 target nucleic acids are NOT DETECTED.  The SARS-CoV-2 RNA is generally detectable in upper and lower respiratory specimens during the acute phase of infection. The lowest concentration of SARS-CoV-2 viral copies this assay can detect is 250 copies / mL. A negative result does not preclude SARS-CoV-2 infection and should not be used as the sole basis for treatment or other patient management decisions.  A negative result may occur with improper specimen collection / handling, submission of specimen other than nasopharyngeal swab, presence of viral mutation(s) within the areas targeted by this assay, and inadequate number of viral copies (<250  copies / mL). A negative result must be combined with clinical observations, patient history, and epidemiological information.  Fact Sheet for Patients:   RoadLapTop.co.za  Fact Sheet for Healthcare Providers: http://kim-miller.com/  This test is not yet approved or  cleared by the [place] FDA and has been authorized for detection and/or diagnosis of SARS-CoV-2 by FDA under an Emergency Use Authorization (EUA).  This EUA will remain in effect (meaning this test can be used) for the duration of the COVID-19 declaration under Section 564(b)(1) of the Act, 21 U.S.C. section 360bbb-3(b)(1), unless the authorization is terminated or revoked sooner.  Performed at [facility], [address] [place], [place]  [address]          Radiology Studies: DG Chest 2 View  Result Date: 01/27/2023 CLINICAL DATA:  57 year old male with fever and urinary retention. EXAM: CHEST - 2 VIEW COMPARISON:  CT Abdomen and Pelvis 0730 hours today. Chest radiographs 07/30/2008. FINDINGS: Lung volumes and mediastinal contours are normal. Visualized tracheal air column is within normal limits. Right lung calcified granuloma is less apparent since 2010. Otherwise both lungs appear clear. No pneumothorax or pleural effusion. Paucity of bowel gas in the upper abdomen. Previous cervical ACDF. No acute osseous abnormality identified. IMPRESSION: No acute cardiopulmonary abnormality. Electronically Signed   By: Odessa Fleming M.D.   On: 01/27/2023 07:59   CT Renal Stone Study  Result Date: 01/27/2023 CLINICAL DATA:  57 year old male with abdomen and flank pain. Fever and chills. Urinary retention. History of kidney stones. EXAM: CT ABDOMEN AND PELVIS WITHOUT CONTRAST TECHNIQUE: Multidetector CT imaging of the abdomen and pelvis was performed following the standard protocol without IV contrast. RADIATION DOSE REDUCTION: This exam was performed according to the  departmental dose-optimization program which includes automated exposure control, adjustment of the mA and/or kV according to patient size and/or use of iterative reconstruction technique. COMPARISON:  None. FINDINGS: Lower chest: Negative. Hepatobiliary: Small but circumscribed low-density area in the right hepatic lobe series 2, image 10 has simple fluid density and is most compatible with benign cyst (no follow-up imaging recommended). Otherwise negative visible noncontrast liver and gallbladder. Pancreas: Negative. Spleen: Negative. Adrenals/Urinary Tract: Normal adrenal glands. Punctate right lower pole nephrolithiasis on coronal image 37. No hydronephrosis. Symmetric pararenal space stranding, nonspecific. No hydro. Bladder decompressed by Foley catheter. Stomach/Bowel: Large bowel redundancy and retained stool. Normal retrocecal appendix. No large bowel inflammation. Nondilated small bowel, stomach. Negative duodenum. No free air or free fluid. Vascular/Lymphatic: Aortoiliac calcified atherosclerosis. Normal caliber abdominal aorta. Vascular patency is not evaluated in the absence of IV contrast. No lymphadenopathy identified. Reproductive: Prostatomegaly is indistinct, urethral catheter in place. Other: No pelvis free fluid. Musculoskeletal: Negative; occasional benign pelvic bone islands such as series 2, image 79. IMPRESSION: 1. Bladder decompressed by Foley catheter. Indistinct Prostatomegaly. Punctate right nephrolithiasis with no evidence of renal obstruction. Consider prostate related bladder obstruction. 2. No other acute or inflammatory process identified in the noncontrast abdomen or pelvis. Retained stool.  Aortic Atherosclerosis (ICD10-I70.0). Electronically Signed   By: Odessa Fleming M.D.   On: 01/27/2023 07:55    Scheduled Meds:  finasteride  5 mg Oral Daily   losartan  50 mg Oral Daily   metoprolol succinate  25 mg Oral Daily   ondansetron  4 mg Intravenous Once   tamsulosin  0.8 mg Oral QHS    Continuous Infusions:  sodium chloride Stopped (01/27/23 0937)   ceFEPime (MAXIPIME) IV 2 g (01/28/23 0014)   lactated ringers 150 mL/hr (01/27/23 1149)     LOS: 1 day   Time spent:  Azucena Fallen, DO [facility]  If 7PM-7AM, please contact night-coverage [email or web address]  01/28/2023, 8:11 AM

## 2023-01-28 NOTE — Progress Notes (Signed)
   01/28/23 0911  TOC Brief Assessment  Insurance and Status Reviewed  Patient has primary care physician Yes  Home environment has been reviewed with spouse  Prior level of function: independent  Prior/Current Home Services No current home services  Social Determinants of Health Reivew SDOH reviewed no interventions necessary  Readmission risk has been reviewed Yes  Transition of care needs no transition of care needs at this time

## 2023-01-28 NOTE — Plan of Care (Signed)

## 2023-01-29 ENCOUNTER — Other Ambulatory Visit (HOSPITAL_COMMUNITY): Payer: Self-pay

## 2023-01-29 DIAGNOSIS — N390 Urinary tract infection, site not specified: Secondary | ICD-10-CM | POA: Diagnosis not present

## 2023-01-29 DIAGNOSIS — A419 Sepsis, unspecified organism: Secondary | ICD-10-CM | POA: Diagnosis not present

## 2023-01-29 LAB — CBC WITH DIFFERENTIAL/PLATELET
Abs Immature Granulocytes: 0.09 10*3/uL — ABNORMAL HIGH (ref 0.00–0.07)
Basophils Absolute: 0.1 10*3/uL (ref 0.0–0.1)
Basophils Relative: 1 %
Eosinophils Absolute: 0.2 10*3/uL (ref 0.0–0.5)
Eosinophils Relative: 2 %
HCT: 37.8 % — ABNORMAL LOW (ref 39.0–52.0)
Hemoglobin: 12.3 g/dL — ABNORMAL LOW (ref 13.0–17.0)
Immature Granulocytes: 1 %
Lymphocytes Relative: 15 %
Lymphs Abs: 1.4 10*3/uL (ref 0.7–4.0)
MCH: 28.7 pg (ref 26.0–34.0)
MCHC: 32.5 g/dL (ref 30.0–36.0)
MCV: 88.3 fL (ref 80.0–100.0)
Monocytes Absolute: 0.8 10*3/uL (ref 0.1–1.0)
Monocytes Relative: 9 %
Neutro Abs: 6.7 10*3/uL (ref 1.7–7.7)
Neutrophils Relative %: 72 %
Platelets: 223 10*3/uL (ref 150–400)
RBC: 4.28 MIL/uL (ref 4.22–5.81)
RDW: 12.2 % (ref 11.5–15.5)
WBC: 9.3 10*3/uL (ref 4.0–10.5)
nRBC: 0 % (ref 0.0–0.2)

## 2023-01-29 LAB — COMPREHENSIVE METABOLIC PANEL
ALT: 207 U/L — ABNORMAL HIGH (ref 0–44)
AST: 114 U/L — ABNORMAL HIGH (ref 15–41)
Albumin: 3 g/dL — ABNORMAL LOW (ref 3.5–5.0)
Alkaline Phosphatase: 104 U/L (ref 38–126)
Anion gap: 11 (ref 5–15)
BUN: 20 mg/dL (ref 6–20)
CO2: 20 mmol/L — ABNORMAL LOW (ref 22–32)
Calcium: 8.7 mg/dL — ABNORMAL LOW (ref 8.9–10.3)
Chloride: 107 mmol/L (ref 98–111)
Creatinine, Ser: 1.09 mg/dL (ref 0.61–1.24)
GFR, Estimated: >60 mL/min (ref >60)
Glucose, Bld: 104 mg/dL — ABNORMAL HIGH (ref 70–99)
Potassium: 4 mmol/L (ref 3.5–5.1)
Sodium: 138 mmol/L (ref 135–145)
Total Bilirubin: 0.6 mg/dL (ref 0.3–1.2)
Total Protein: 6.1 g/dL — ABNORMAL LOW (ref 6.5–8.1)

## 2023-01-29 MED ORDER — IBUPROFEN 200 MG PO TABS
600.0000 mg | ORAL_TABLET | ORAL | Status: DC | PRN
  Administered 2023-01-29 – 2023-01-31 (×4): 600 mg via ORAL
  Filled 2023-01-29 (×4): qty 3

## 2023-01-29 MED ORDER — ONDANSETRON HCL 4 MG PO TABS
4.0000 mg | ORAL_TABLET | Freq: Four times a day (QID) | ORAL | 0 refills | Status: DC | PRN
  Filled 2023-01-29: qty 20, 5d supply, fill #0

## 2023-01-29 MED ORDER — CIPROFLOXACIN HCL 500 MG PO TABS
500.0000 mg | ORAL_TABLET | Freq: Two times a day (BID) | ORAL | 0 refills | Status: DC
  Filled 2023-01-29: qty 14, 7d supply, fill #0

## 2023-01-29 MED ORDER — OXYBUTYNIN CHLORIDE 5 MG PO TABS
2.5000 mg | ORAL_TABLET | Freq: Once | ORAL | Status: AC
  Administered 2023-01-30: 2.5 mg via ORAL
  Filled 2023-01-29: qty 1

## 2023-01-29 MED ORDER — IBUPROFEN 600 MG PO TABS
600.0000 mg | ORAL_TABLET | ORAL | 0 refills | Status: DC | PRN
  Filled 2023-01-29: qty 30, 5d supply, fill #0

## 2023-01-29 MED ORDER — HYOSCYAMINE SULFATE 0.125 MG SL SUBL
0.1250 mg | SUBLINGUAL_TABLET | SUBLINGUAL | 0 refills | Status: DC | PRN
  Filled 2023-01-29: qty 30, 5d supply, fill #0

## 2023-01-29 NOTE — Plan of Care (Signed)

## 2023-01-29 NOTE — Progress Notes (Signed)
PROGRESS NOTE    Eugene Brown  YQM:[id number] DOB: [birth year] DOA: 01/27/2023 PCP: Shade Flood, MD   Brief Narrative:  Eugene Brown is a 57 y.o. male with medical history significant of GERD, hypertension, mild dilation of the ascending aorta, PACs, PVCs who recently had an episode of urinary retention requiring Foley catheter insertion, this was followed by an E. coli UTI that was sensitive to cephalexin.  He spent 9 days with a Foley catheter inserted.   This was discontinued 2 days prior to admission without immediate complication. Acutely febrile the morning of admission with worsening pelvic pressure/pain  Assessment & Plan:   Principal Problem:   Sepsis secondary to UTI [facility]) Active Problems:   Essential hypertension   GERD   Transaminitis   Sepsis secondary to UTI (HCC) In the setting of leukocytosis, fever, and noted source given UTI Continue cefepime 2 g every 8 hours - transition to Cipro for ongoing coverage (questionable failure of keflex prior to admission but improved here while on cefepime). Current urine culture shows insignificant growth - presume Ecoli culture from just prior to hospitalization is most likely organism. Urology consult appreciated.   Essential hypertension Continue losartan 50 mg p.o. daily. Continue metoprolol succinate 25 mg p.o. daily.   GERD Antiacid, H2 blocker or PPI as needed.   Elevated liver panel, stable Has been taking OTC APAP and NSAIDs. Hold rosuvastatin, hold acetaminophen Labs stable, continue to follow If uptrending will order RUQ [place] in the next 24h  DVT prophylaxis: SCDs Start: 01/27/23 1202 Code Status:   Code Status: Full Code Family Communication: None present  Status is: Inpt  Dispo: The patient is from: home              Anticipated d/c is to: home              Anticipated d/c date is: 24-48h              Patient currently NOT medically stable for discharge  Consultants:  Urology  Procedures:   none  Antimicrobials:  Cefepime   Subjective: Continues to complain of intractable bladder pain/spasms overnight. ROS otherwise unremarkable.  Objective: Vitals:   01/28/23 1126 01/28/23 1541 01/28/23 1948 01/29/23 0514  BP: 136/83 129/81 129/72 (!) 138/91  Pulse: (!) 56 (!) 56 (!) 54 (!) 59  Resp:   19 16  Temp: 99 F (37.2 C) 98.4 F (36.9 C) 98.2 F (36.8 C) 98.1 F (36.7 C)  TempSrc: Oral Oral Oral Oral  SpO2: 99% 97% 98% 97%  Weight:      Height:        Intake/Output Summary (Last 24 hours) at 01/29/2023 0746 Last data filed at 01/29/2023 0612 Gross per 24 hour  Intake 1460 ml  Output 2175 ml  Net -715 ml   Filed Weights   01/27/23 0630 01/27/23 1100  Weight: 81.6 kg 83.5 kg    Examination:  General exam: Appears calm and comfortable  Respiratory system: Clear to auscultation. Respiratory effort normal. Cardiovascular system: S1 & S2 heard, RRR. No JVD, murmurs, rubs, gallops or clicks. No pedal edema. GI/GU: Nondistended, soft and nontender. Foley catheter intact Central nervous system: Alert and oriented. No focal neurological deficits. Extremities: Symmetric 5 x 5 power. Skin: No rashes, lesions or ulcers  Data Reviewed: I have personally reviewed following labs and imaging studies  CBC: Recent Labs  Lab 01/27/23 0745 01/28/23 0532 01/29/23 0522  WBC 11.0* 12.4* 9.3  NEUTROABS 8.6* 9.9* 6.7  HGB 14.0 12.1* 12.3*  HCT 41.2 35.9* 37.8*  MCV 85.1 87.8 88.3  PLT 216 201 223   Basic Metabolic Panel: Recent Labs  Lab 01/27/23 0745 01/28/23 0532 01/29/23 0522  NA 140 134* 138  K 4.4 3.5 4.0  CL 105 104 107  CO2 25 20* 20*  GLUCOSE 114* 116* 104*  BUN 16 14 20   CREATININE 1.12 1.15 1.09  CALCIUM 9.2 8.2* 8.7*   GFR: Estimated Creatinine Clearance: 75.3 mL/min (by C-G formula based on SCr of 1.09 mg/dL). Liver Function Tests: Recent Labs  Lab 01/27/23 0745 01/28/23 0532 01/29/23 0522  AST 73* 73* 114*  ALT 134* 134* 207*  ALKPHOS 101  101 104  BILITOT 0.8 0.8 0.6  PROT 7.1 5.8* 6.1*  ALBUMIN 4.1 2.9* 3.0*   Recent Labs  Lab 01/27/23 0745  LIPASE 15   Sepsis Labs: Recent Labs  Lab 01/27/23 0745 01/27/23 0953  LATICACIDVEN 2.2* 1.1   Recent Results (from the past 240 hour(s))  Urine Culture     Status: Abnormal   Collection Time: 01/22/23 11:36 AM   Specimen: Urine, Random  Result Value Ref Range Status   Specimen Description   Final    URINE, RANDOM Performed at [facility], [address], [place], [place] [address]    Special Requests   Final    NONE Reflexed from [id number] Performed at [facility], [address] [place], [place] [address]    Culture >=100,000 COLONIES/mL ESCHERICHIA COLI (A)  Final   Report Status 01/24/2023 FINAL  Final   Organism ID, Bacteria ESCHERICHIA COLI (A)  Final      Susceptibility   Escherichia coli - MIC*    AMPICILLIN >=32 RESISTANT Resistant     CEFAZOLIN <=4 SENSITIVE Sensitive     CEFEPIME <=0.12 SENSITIVE Sensitive     CEFTRIAXONE <=0.25 SENSITIVE Sensitive     CIPROFLOXACIN <=0.25 SENSITIVE Sensitive     GENTAMICIN <=1 SENSITIVE Sensitive     IMIPENEM <=0.25 SENSITIVE Sensitive     NITROFURANTOIN <=16 SENSITIVE Sensitive     TRIMETH/SULFA <=20 SENSITIVE Sensitive     AMPICILLIN/SULBACTAM 8 SENSITIVE Sensitive     PIP/TAZO <=4 SENSITIVE Sensitive     * >=100,000 COLONIES/mL ESCHERICHIA COLI  Blood culture (routine x 2)     Status: None (Preliminary result)   Collection Time: 01/27/23  6:50 AM   Specimen: BLOOD  Result Value Ref Range Status   Specimen Description   Final    BLOOD RIGHT ANTECUBITAL Performed at [facility], [address] [place], [place] [address]    Special Requests   Final    BOTTLES DRAWN AEROBIC AND ANAEROBIC Blood Culture results may not be optimal due to an excessive volume of blood received in culture bottles Performed at [facility], [address] [place], [place] [address]    Culture   Final    NO GROWTH < 24 HOURS Performed at [facility], [address] [place], [place] [address]    Report Status PENDING  Incomplete  Urine Culture     Status: Abnormal   Collection Time: 01/27/23  7:03 AM   Specimen: Urine, Clean Catch  Result Value Ref Range Status   Specimen Description   Final    URINE, CLEAN CATCH Performed at [facility], [address], [place], [place] [address]    Special Requests   Final    NONE Performed at [facility], [phone]  [place], [place], [place] [address]    Culture (A)  Final    <10,000 COLONIES/mL INSIGNIFICANT GROWTH Performed at [facility], [address] [place], [place] [address]    Report Status 01/28/2023 FINAL  Final  Blood culture (routine x 2)     Status: None (Preliminary result)   Collection Time: 01/27/23  7:45 AM   Specimen: BLOOD  Result Value Ref Range Status   Specimen Description   Final    BLOOD LEFT ANTECUBITAL Performed at [facility], [address] [place], [place] [address]    Special Requests   Final    BOTTLES DRAWN AEROBIC AND ANAEROBIC Blood Culture results may not be optimal due to an excessive volume of blood received in culture bottles Performed at [facility], [address] [place], [place] [address]    Culture   Final    NO GROWTH < 24 HOURS Performed at [facility], [address], [place], [place] [address]    Report Status PENDING  Incomplete  SARS Coronavirus 2 by RT PCR (hospital order, performed in [facility] hospital lab) *cepheid single result test*     Status: None   Collection Time: 01/27/23  7:45 AM   Specimen: Nasal Swab  Result Value Ref Range Status   SARS Coronavirus 2 by RT PCR NEGATIVE NEGATIVE Final    Comment: (NOTE) SARS-CoV-2 target nucleic acids are NOT DETECTED.  The SARS-CoV-2 RNA is generally detectable in upper and  lower respiratory specimens during the acute phase of infection. The lowest concentration of SARS-CoV-2 viral copies this assay can detect is 250 copies / mL. A negative result does not preclude SARS-CoV-2 infection and should not be used as the sole basis for treatment or other patient management decisions.  A negative result may occur with improper specimen collection / handling, submission of specimen other than nasopharyngeal swab, presence of viral mutation(s) within the areas targeted by this assay, and inadequate number of viral copies (<250 copies / mL). A negative result must be combined with clinical observations, patient history, and epidemiological information.  Fact Sheet for Patients:   RoadLapTop.co.za  Fact Sheet for Healthcare Providers: http://kim-miller.com/  This test is not yet approved or  cleared by the [place] FDA and has been authorized for detection and/or diagnosis of SARS-CoV-2 by FDA under an Emergency Use Authorization (EUA).  This EUA will remain in effect (meaning this test can be used) for the duration of the COVID-19 declaration under Section 564(b)(1) of the Act, 21 U.S.C. section 360bbb-3(b)(1), unless the authorization is terminated or revoked sooner.  Performed at [facility], [address], [place], [place] [address]          Radiology Studies: No results found.  Scheduled Meds:  Chlorhexidine Gluconate Cloth  6 each Topical Daily   finasteride  5 mg Oral Daily   losartan  50 mg Oral Daily   metoprolol succinate  25 mg Oral Daily   ondansetron  4 mg Intravenous Once   tamsulosin  0.8 mg Oral QHS   Continuous Infusions:  sodium chloride Stopped (01/27/23 0937)   ceFEPime (MAXIPIME) IV Stopped (01/29/23 0124)     LOS: 2 days   Time spent:  Azucena Fallen, DO [facility]  If 7PM-7AM, please contact  night-coverage [email or web address]  01/29/2023, 7:46 AM

## 2023-01-29 NOTE — Plan of Care (Signed)
  Problem: Education: Goal: Knowledge of General Education information will improve Description: Including pain rating scale, medication(s)/side effects and non-pharmacologic comfort measures Outcome: Progressing   Problem: Clinical Measurements: Goal: Ability to maintain clinical measurements within normal limits will improve Outcome: Progressing Goal: Respiratory complications will improve Outcome: Progressing Goal: Cardiovascular complication will be avoided Outcome: Progressing   Problem: Elimination: Goal: Will not experience complications related to urinary retention Outcome: Progressing   Problem: Pain Managment: Goal: General experience of comfort will improve Outcome: Progressing   Problem: Clinical Measurements: Goal: Diagnostic test results will improve Outcome: Progressing Goal: Signs and symptoms of infection will decrease Outcome: Progressing   Problem: Respiratory: Goal: Ability to maintain adequate ventilation will improve Outcome: Progressing

## 2023-01-30 ENCOUNTER — Other Ambulatory Visit (HOSPITAL_COMMUNITY): Payer: Self-pay

## 2023-01-30 DIAGNOSIS — N390 Urinary tract infection, site not specified: Secondary | ICD-10-CM | POA: Diagnosis not present

## 2023-01-30 DIAGNOSIS — A419 Sepsis, unspecified organism: Secondary | ICD-10-CM | POA: Diagnosis not present

## 2023-01-30 LAB — COMPREHENSIVE METABOLIC PANEL
ALT: 317 U/L — ABNORMAL HIGH (ref 0–44)
AST: 118 U/L — ABNORMAL HIGH (ref 15–41)
Albumin: 2.9 g/dL — ABNORMAL LOW (ref 3.5–5.0)
Alkaline Phosphatase: 107 U/L (ref 38–126)
Anion gap: 10 (ref 5–15)
BUN: 16 mg/dL (ref 6–20)
CO2: 21 mmol/L — ABNORMAL LOW (ref 22–32)
Calcium: 8.4 mg/dL — ABNORMAL LOW (ref 8.9–10.3)
Chloride: 107 mmol/L (ref 98–111)
Creatinine, Ser: 1.1 mg/dL (ref 0.61–1.24)
GFR, Estimated: >60 mL/min (ref >60)
Glucose, Bld: 171 mg/dL — ABNORMAL HIGH (ref 70–99)
Potassium: 3.9 mmol/L (ref 3.5–5.1)
Sodium: 138 mmol/L (ref 135–145)
Total Bilirubin: 0.5 mg/dL (ref 0.3–1.2)
Total Protein: 6.1 g/dL — ABNORMAL LOW (ref 6.5–8.1)

## 2023-01-30 MED ORDER — TRAMADOL HCL 50 MG PO TABS
100.0000 mg | ORAL_TABLET | Freq: Four times a day (QID) | ORAL | Status: DC | PRN
  Administered 2023-01-30 (×2): 100 mg via ORAL
  Filled 2023-01-30 (×2): qty 2

## 2023-01-30 MED ORDER — HYOSCYAMINE SULFATE 0.125 MG SL SUBL
0.2500 mg | SUBLINGUAL_TABLET | SUBLINGUAL | Status: DC | PRN
  Filled 2023-01-30: qty 2

## 2023-01-30 MED ORDER — TRAMADOL HCL 50 MG PO TABS: 100.0000 mg | ORAL_TABLET | Freq: Four times a day (QID) | ORAL | 0 refills | Status: DC | PRN

## 2023-01-30 MED ORDER — HYOSCYAMINE SULFATE 0.125 MG SL SUBL
0.2500 mg | SUBLINGUAL_TABLET | SUBLINGUAL | 0 refills | Status: DC | PRN
  Filled 2023-01-30: qty 30, 3d supply, fill #0

## 2023-01-30 MED ORDER — HYOSCYAMINE SULFATE 0.125 MG SL SUBL
0.1250 mg | SUBLINGUAL_TABLET | Freq: Once | SUBLINGUAL | Status: AC
  Administered 2023-01-30: 0.125 mg via ORAL
  Filled 2023-01-30: qty 1

## 2023-01-30 MED ORDER — MORPHINE SULFATE (PF) 2 MG/ML IV SOLN
2.0000 mg | INTRAVENOUS | Status: DC | PRN
  Administered 2023-01-30 (×3): 2 mg via INTRAVENOUS
  Filled 2023-01-30 (×3): qty 1

## 2023-01-30 NOTE — Plan of Care (Addendum)
  Problem: Education: Goal: Knowledge of General Education information will improve Description: Including pain rating scale, medication(s)/side effects and non-pharmacologic comfort measures Outcome: Progressing   Problem: Health Behavior/Discharge Planning: Goal: Ability to manage health-related needs will improve Outcome: Progressing   Problem: Clinical Measurements: Goal: Ability to maintain clinical measurements within normal limits will improve Outcome: Not Progressing Note: Pt continues to have painful bladder spasms with no apparent cause.      Problem: Elimination: Goal: Will not experience complications related to urinary retention Outcome: Not Progressing

## 2023-01-30 NOTE — Progress Notes (Signed)
PROGRESS NOTE    Eugene Brown  ION:[id number] DOB: 1965-08-21 DOA: 01/27/2023 PCP: Shade Flood, MD   Brief Narrative:  Eugene Brown is a 57 y.o. male with medical history significant of GERD, hypertension, mild dilation of the ascending aorta, PACs, PVCs who recently had an episode of urinary retention requiring Foley catheter insertion, this was followed by an E. coli UTI that was sensitive to cephalexin.  He spent 9 days with a Foley catheter inserted.   This was discontinued 2 days prior to admission without immediate complication. Acutely febrile the morning of admission with worsening pelvic pressure/pain  Assessment & Plan:   Principal Problem:   Sepsis secondary to UTI [facility]) Active Problems:   Essential hypertension   GERD   Transaminitis  Acute intractable pain -Post foley placement patient has been having ongoing spasms/pain at the tip of the penis without notable lesion/tenderness to palpation -Trial of increased hyoscyamine, tramadol with no improvement - excruciating pain with obvious diaphoresis and unable to interact/speak during episodes- Urology sidelined- attempting to moisturize tip of penis at foley insertion(bacitracin/vasoline) - given worsening symptoms low dose morphine initiated - now requiring 2mg  nearly every 2-3hours to maintain symptom control.  Sepsis secondary to UTI [facility]) In the setting of leukocytosis, fever, and noted source given UTI Continue cefepime 2 g every 8 hours - transition to Cipro for ongoing coverage (questionable failure of keflex prior to admission but improved here while on cefepime). Current urine culture shows insignificant growth - presume Ecoli culture from just prior to hospitalization is most likely organism. Urology consult appreciated.   Essential hypertension Continue losartan 50 mg p.o. daily. Continue metoprolol succinate 25 mg p.o. daily.   GERD Antiacid, H2 blocker or PPI as needed.   Elevated liver  panel, stable Has been taking OTC APAP and NSAIDs. Hold rosuvastatin, hold acetaminophen Labs stable, continue to follow If uptrending will order RUQ [place] in the next 24h  DVT prophylaxis: SCDs Start: 01/27/23 1202 Code Status:   Code Status: Full Code Family Communication: None present  Status is: Inpt  Dispo: The patient is from: home              Anticipated d/c is to: home              Anticipated d/c date is: 24-48h              Patient currently NOT medically stable for discharge given ongoing intractable pain requiring IV narcotics  Consultants:  Urology  Procedures:  none  Antimicrobials:  Cefepime   Subjective: Continues to complain of intractable bladder pain/spasms overnight - worsening over the past 12h.  Objective: Vitals:   01/29/23 0514 01/29/23 1304 01/29/23 1938 01/30/23 0625  BP: (!) 138/91 (!) 144/73 132/76 (!) 155/97  Pulse: (!) 59 (!) 51 (!) 52 (!) 57  Resp: 16 20 20 20   Temp: 98.1 F (36.7 C) 97.7 F (36.5 C) 98.4 F (36.9 C) 98 F (36.7 C)  TempSrc: Oral Oral Oral Oral  SpO2: 97% 99% 97% 100%  Weight:      Height:        Intake/Output Summary (Last 24 hours) at 01/30/2023 1624 Last data filed at 01/30/2023 0500 Gross per 24 hour  Intake 236 ml  Output 1875 ml  Net -1639 ml   Filed Weights   01/27/23 0630 01/27/23 1100  Weight: 81.6 kg 83.5 kg    Examination:  General exam: Appears calm and comfortable  Respiratory system: Clear to auscultation.  Respiratory effort normal. Cardiovascular system: S1 & S2 heard, RRR. No JVD, murmurs, rubs, gallops or clicks. No pedal edema. GI/GU: Nondistended, soft and nontender. Foley catheter intact Central nervous system: Alert and oriented. No focal neurological deficits. Extremities: Symmetric 5 x 5 power. Skin: No rashes, lesions or ulcers  Data Reviewed: I have personally reviewed following labs and imaging studies  CBC: Recent Labs  Lab 01/27/23 0745 01/28/23 0532 01/29/23 0522  WBC  11.0* 12.4* 9.3  NEUTROABS 8.6* 9.9* 6.7  HGB 14.0 12.1* 12.3*  HCT 41.2 35.9* 37.8*  MCV 85.1 87.8 88.3  PLT 216 201 223   Basic Metabolic Panel: Recent Labs  Lab 01/27/23 0745 01/28/23 0532 01/29/23 0522 01/30/23 0833  NA 140 134* 138 138  K 4.4 3.5 4.0 3.9  CL 105 104 107 107  CO2 25 20* 20* 21*  GLUCOSE 114* 116* 104* 171*  BUN 16 14 20 16   CREATININE 1.12 1.15 1.09 1.10  CALCIUM 9.2 8.2* 8.7* 8.4*   GFR: Estimated Creatinine Clearance: 74.7 mL/min (by C-G formula based on SCr of 1.1 mg/dL). Liver Function Tests: Recent Labs  Lab 01/27/23 0745 01/28/23 0532 01/29/23 0522 01/30/23 0833  AST 73* 73* 114* 118*  ALT 134* 134* 207* 317*  ALKPHOS 101 101 104 107  BILITOT 0.8 0.8 0.6 0.5  PROT 7.1 5.8* 6.1* 6.1*  ALBUMIN 4.1 2.9* 3.0* 2.9*   Recent Labs  Lab 01/27/23 0745  LIPASE 15   Sepsis Labs: Recent Labs  Lab 01/27/23 0745 01/27/23 0953  LATICACIDVEN 2.2* 1.1   Recent Results (from the past 240 hour(s))  Urine Culture     Status: Abnormal   Collection Time: 01/22/23 11:36 AM   Specimen: Urine, Random  Result Value Ref Range Status   Specimen Description   Final    URINE, RANDOM Performed at [facility], [address] [place], [place] [address]    Special Requests   Final    NONE Reflexed from [id number] Performed at [facility], [address] [place], [place] [address]    Culture >=100,000 COLONIES/mL ESCHERICHIA COLI (A)  Final   Report Status 01/24/2023 FINAL  Final   Organism ID, Bacteria ESCHERICHIA COLI (A)  Final      Susceptibility   Escherichia coli - MIC*    AMPICILLIN >=32 RESISTANT Resistant     CEFAZOLIN <=4 SENSITIVE Sensitive     CEFEPIME <=0.12 SENSITIVE Sensitive     CEFTRIAXONE <=0.25 SENSITIVE Sensitive     CIPROFLOXACIN <=0.25 SENSITIVE Sensitive     GENTAMICIN <=1 SENSITIVE Sensitive     IMIPENEM <=0.25 SENSITIVE Sensitive     NITROFURANTOIN <=16 SENSITIVE Sensitive      TRIMETH/SULFA <=20 SENSITIVE Sensitive     AMPICILLIN/SULBACTAM 8 SENSITIVE Sensitive     PIP/TAZO <=4 SENSITIVE Sensitive     * >=100,000 COLONIES/mL ESCHERICHIA COLI  Blood culture (routine x 2)     Status: None (Preliminary result)   Collection Time: 01/27/23  6:50 AM   Specimen: BLOOD  Result Value Ref Range Status   Specimen Description   Final    BLOOD RIGHT ANTECUBITAL Performed at [facility], [address] [place], [place] [address]    Special Requests   Final    BOTTLES DRAWN AEROBIC AND ANAEROBIC Blood Culture results may not be optimal due to an excessive volume of blood received in culture bottles Performed at [facility], [address], [place], [place] [address]    Culture   Final  NO GROWTH 3 DAYS Performed at [facility], [address], [place], [place] [address]    Report Status PENDING  Incomplete  Urine Culture     Status: Abnormal   Collection Time: 01/27/23  7:03 AM   Specimen: Urine, Clean Catch  Result Value Ref Range Status   Specimen Description   Final    URINE, CLEAN CATCH Performed at [facility], [address] [place], [place] [address]    Special Requests   Final    NONE Performed at [facility], [address] [place], [place] [address]    Culture (A)  Final    <10,000 COLONIES/mL INSIGNIFICANT GROWTH Performed at [facility], [address] [place], [place] [address]    Report Status 01/28/2023 FINAL  Final  Blood culture (routine x 2)     Status: None (Preliminary result)   Collection Time: 01/27/23  7:45 AM   Specimen: BLOOD  Result Value Ref Range Status   Specimen Description   Final    BLOOD LEFT ANTECUBITAL Performed at [facility], [address], [place], [place] [address]    Special Requests   Final    BOTTLES DRAWN AEROBIC AND ANAEROBIC Blood Culture results may not be optimal due to an excessive  volume of blood received in culture bottles Performed at [facility], [address] [place], [place] [address]    Culture   Final    NO GROWTH 3 DAYS Performed at [facility], [address] [place], [place] [address]    Report Status PENDING  Incomplete  SARS Coronavirus 2 by RT PCR (hospital order, performed in [facility] hospital lab) *cepheid single result test*     Status: None   Collection Time: 01/27/23  7:45 AM   Specimen: Nasal Swab  Result Value Ref Range Status   SARS Coronavirus 2 by RT PCR NEGATIVE NEGATIVE Final    Comment: (NOTE) SARS-CoV-2 target nucleic acids are NOT DETECTED.  The SARS-CoV-2 RNA is generally detectable in upper and lower respiratory specimens during the acute phase of infection. The lowest concentration of SARS-CoV-2 viral copies this assay can detect is 250 copies / mL. A negative result does not preclude SARS-CoV-2 infection and should not be used as the sole basis for treatment or other patient management decisions.  A negative result may occur with improper specimen collection / handling, submission of specimen other than nasopharyngeal swab, presence of viral mutation(s) within the areas targeted by this assay, and inadequate number of viral copies (<250 copies / mL). A negative result must be combined with clinical observations, patient history, and epidemiological information.  Fact Sheet for Patients:   RoadLapTop.co.za  Fact Sheet for Healthcare Providers: http://kim-miller.com/  This test is not yet approved or  cleared by the [place] FDA and has been authorized for detection and/or diagnosis of SARS-CoV-2 by FDA under an Emergency Use Authorization (EUA).  This EUA will remain in effect (meaning this test can be used) for the duration of the COVID-19 declaration under Section 564(b)(1) of the Act, 21 U.S.C. section 360bbb-3(b)(1), unless the  authorization is terminated or revoked sooner.  Performed at [facility], [address] [place], [place] [address]          Radiology Studies: No results found.  Scheduled Meds:  Chlorhexidine Gluconate Cloth  6 each Topical Daily   finasteride  5 mg Oral Daily   losartan  50 mg Oral Daily  metoprolol succinate  25 mg Oral Daily   tamsulosin  0.8 mg Oral QHS   Continuous Infusions:  sodium chloride Stopped (01/27/23 0937)   ceFEPime (MAXIPIME) IV 2 g (01/30/23 1613)     LOS: 3 days   Time spent:  Azucena Fallen, DO [facility]  If 7PM-7AM, please contact night-coverage [email or web address]  01/30/2023, 4:24 PM

## 2023-01-31 ENCOUNTER — Other Ambulatory Visit (HOSPITAL_COMMUNITY): Payer: Self-pay

## 2023-01-31 ENCOUNTER — Other Ambulatory Visit (HOSPITAL_BASED_OUTPATIENT_CLINIC_OR_DEPARTMENT_OTHER): Payer: Self-pay

## 2023-01-31 DIAGNOSIS — A419 Sepsis, unspecified organism: Secondary | ICD-10-CM | POA: Diagnosis not present

## 2023-01-31 DIAGNOSIS — N390 Urinary tract infection, site not specified: Secondary | ICD-10-CM | POA: Diagnosis not present

## 2023-01-31 LAB — CBC
HCT: 39.5 % (ref 39.0–52.0)
Hemoglobin: 12.5 g/dL — ABNORMAL LOW (ref 13.0–17.0)
MCH: 28.6 pg (ref 26.0–34.0)
MCHC: 31.6 g/dL (ref 30.0–36.0)
MCV: 90.4 fL (ref 80.0–100.0)
Platelets: 275 10*3/uL (ref 150–400)
RBC: 4.37 MIL/uL (ref 4.22–5.81)
RDW: 11.9 % (ref 11.5–15.5)
WBC: 7.2 10*3/uL (ref 4.0–10.5)
nRBC: 0 % (ref 0.0–0.2)

## 2023-01-31 LAB — COMPREHENSIVE METABOLIC PANEL
ALT: 245 U/L — ABNORMAL HIGH (ref 0–44)
AST: 57 U/L — ABNORMAL HIGH (ref 15–41)
Albumin: 3 g/dL — ABNORMAL LOW (ref 3.5–5.0)
Alkaline Phosphatase: 95 U/L (ref 38–126)
Anion gap: 11 (ref 5–15)
BUN: 16 mg/dL (ref 6–20)
CO2: 23 mmol/L (ref 22–32)
Calcium: 8.8 mg/dL — ABNORMAL LOW (ref 8.9–10.3)
Chloride: 104 mmol/L (ref 98–111)
Creatinine, Ser: 0.93 mg/dL (ref 0.61–1.24)
GFR, Estimated: >60 mL/min (ref >60)
Glucose, Bld: 99 mg/dL (ref 70–99)
Potassium: 4.1 mmol/L (ref 3.5–5.1)
Sodium: 138 mmol/L (ref 135–145)
Total Bilirubin: 0.5 mg/dL (ref 0.3–1.2)
Total Protein: 6.2 g/dL — ABNORMAL LOW (ref 6.5–8.1)

## 2023-01-31 MED ORDER — TRAMADOL HCL 50 MG PO TABS
100.0000 mg | ORAL_TABLET | Freq: Four times a day (QID) | ORAL | 0 refills | Status: AC | PRN
  Filled 2023-01-31: qty 28, 4d supply, fill #0

## 2023-01-31 MED ORDER — LIDOCAINE HCL URETHRAL/MUCOSAL 2 % EX GEL
1.0000 | Freq: Three times a day (TID) | CUTANEOUS | Status: DC | PRN
  Filled 2023-01-31: qty 5

## 2023-01-31 MED ORDER — HYOSCYAMINE SULFATE ER 0.375 MG PO TB12
0.3750 mg | ORAL_TABLET | Freq: Once | ORAL | Status: AC
  Administered 2023-01-31: 0.375 mg via ORAL
  Filled 2023-01-31 (×2): qty 1

## 2023-01-31 MED ORDER — CIPROFLOXACIN HCL 500 MG PO TABS
500.0000 mg | ORAL_TABLET | Freq: Two times a day (BID) | ORAL | 0 refills | Status: AC
  Filled 2023-01-31: qty 5, 3d supply, fill #0

## 2023-01-31 MED ORDER — REGENECARE 2 % EX GEL
1.0000 | Freq: Three times a day (TID) | CUTANEOUS | 0 refills | Status: DC | PRN
  Filled 2023-01-31: qty 85, 30d supply, fill #0

## 2023-01-31 NOTE — Progress Notes (Signed)
     Subjective: Catheter pain diminished. NAEON.   Objective: Vital signs in last 24 hours: Temp:  [97.4 F (36.3 C)-98.1 F (36.7 C)] 97.4 F (36.3 C) (09/09 0500) Pulse Rate:  [44-61] 44 (09/09 0500) Resp:  [16] 16 (09/09 0500) BP: (121-129)/(73-82) 121/73 (09/09 0500) SpO2:  [99 %-100 %] 100 % (09/09 0500)  Assessment/Plan: #Elevated PSA:  -He was found to have a PSA of 13.39 on 08/20/2021. Repeat PSA on 08/24/2021 was 10.3. He had no prior PSA evaluation. PSA density 0.07 with prostate volume 129cc.  -He had 13% chance of high-grade prostate cancer and 22% chance of low-grade prostate cancer on PCPT risk assessment.  -S/p TRUS biopsy 09/15/2021 with TRUS volume 129 cc and BPH in all cores.  -PSA on 03/25/2022 was 9.5 and 10.6 on 09/30/2022.  -Prostate MRI 11/20/2022 with PI-RADS category 1, very low likelihood of cancer. Prostate volume 120 cc.    #BPH/LUTS #Urinary Retention  IPSS score is 7, quality-of-life 1 in 09/2022. This has improved on tamsulosin 0.4 mg.  -Started Finasteride 11 days ago. Continue both of these.  -Urinary retention 12/2022 requiring Foley catheter placement for bladder scan 500 mL. -Passed void trial on 01/25/2023.  -Presented to emergency department with complaint of urinary retention on 9//24.  Of note, bladder scan only reflected 362 mL, which would not represent obstructive uropathy. -CT A/P collected 9/4 repeats known severe prostatomegaly.  No hydronephrosis noted. Foley placed 9/4.  Draining clear yellow urine.  - Meeting with consulting urologist for HoLEP on discharge -Transition to leg bag.   #Bladder spasm #urethral pain at meatus -hyoscyamine 0.125/Q4, upped to 0.25 over the weekend - Bacitracin to the meatus TID. Pain has nearly resolved. Pt will ambulate this am if if he doesn't require pain medicine, he may go from a Urologic perspective.    #Pyelonephritis - ruled out #Sepsis - Mild lactic acidosis- resolved - UC w/ insignificant  growth - normothermic for the last 2 days  Intake/Output from previous day: 09/08 0701 - 09/09 0700 In: -  Out: 325 [Urine:325]  Intake/Output this shift: No intake/output data recorded.  Physical Exam:  General: Alert and oriented CV: No cyanosis Lungs: equal chest rise Gu: foley in place draining clear yellow urine  Lab Results: Recent Labs    01/29/23 0522 01/31/23 0521  HGB 12.3* 12.5*  HCT 37.8* 39.5   BMET Recent Labs    01/30/23 0833 01/31/23 0521  NA 138 138  K 3.9 4.1  CL 107 104  CO2 21* 23  GLUCOSE 171* 99  BUN 16 16  CREATININE 1.10 0.93  CALCIUM 8.4* 8.8*     Studies/Results: No results found.    LOS: 4 days   Elmon Kirschner, NP [facility] Pager: [phone]  01/31/2023, 8:46 AM

## 2023-01-31 NOTE — Plan of Care (Signed)
  Problem: Education: Goal: Knowledge of General Education information will improve Description: Including pain rating scale, medication(s)/side effects and non-pharmacologic comfort measures Outcome: Progressing   Problem: Health Behavior/Discharge Planning: Goal: Ability to manage health-related needs will improve Outcome: Progressing   Problem: Clinical Measurements: Goal: Will remain free from infection Outcome: Progressing Goal: Diagnostic test results will improve Outcome: Progressing   Problem: Activity: Goal: Risk for activity intolerance will decrease Outcome: Progressing   Problem: Nutrition: Goal: Adequate nutrition will be maintained Outcome: Progressing   Problem: Coping: Goal: Level of anxiety will decrease Outcome: Progressing   Problem: Pain Managment: Goal: General experience of comfort will improve Outcome: Progressing   Problem: Clinical Measurements: Goal: Diagnostic test results will improve Outcome: Progressing   Problem: Respiratory: Goal: Ability to maintain adequate ventilation will improve Outcome: Progressing

## 2023-01-31 NOTE — TOC Transition Note (Signed)
Transition of Care [facility]) - CM/SW Discharge Note   Patient Details  Name: Eugene Brown MRN: [id number] Date of Birth: [birth year]  Transition of Care [facility]) CM/SW Contact:  Lanier Clam, RN Phone Number: 01/31/2023, 1:09 PM   Clinical Narrative:  d/c home no needs.       Barriers to Discharge: No Barriers Identified   Patient Goals and CMS Choice      Discharge Placement                         Discharge Plan and Services Additional resources added to the After Visit Summary for                                       Social Determinants of Health (SDOH) Interventions SDOH Screenings   Food Insecurity: No Food Insecurity (01/27/2023)  Housing: Low Risk  (01/27/2023)  Transportation Needs: No Transportation Needs (01/27/2023)  Utilities: Not At Risk (01/27/2023)  Alcohol Screen: Low Risk  (08/22/2022)  Depression (PHQ2-9): Low Risk  (08/26/2022)  Financial Resource Strain: Low Risk  (08/22/2022)  Physical Activity: Insufficiently Active (08/22/2022)  Social Connections: Socially Integrated (08/22/2022)  Stress: No Stress Concern Present (08/22/2022)  Tobacco Use: Low Risk  (01/27/2023)     Readmission Risk Interventions     No data to display

## 2023-01-31 NOTE — Plan of Care (Signed)

## 2023-01-31 NOTE — Discharge Summary (Signed)
Physician Discharge Summary  Eugene Brown WUJ:[id number] DOB: 23-Oct-1965 DOA: 01/27/2023  PCP: Shade Flood, MD  Admit date: 01/27/2023 Discharge date: 01/31/2023  Admitted From: Home Disposition:  Home  Recommendations for Outpatient Follow-up:  Follow up with PCP in 1-2 weeks Follow up with urology as scheduled:  Home Health:None  Equipment/Devices:None  Discharge Condition:Stable  CODE STATUS:Full  Diet recommendation:  As tolerated  Brief/Interim Summary: Eugene Brown is a 57 y.o. male with medical history significant of GERD, hypertension, mild dilation of the ascending aorta, PACs, PVCs who recently had an episode of urinary retention requiring Foley catheter insertion, this was followed by an E. coli UTI that was sensitive to cephalexin.  He spent 9 days with a Foley catheter inserted. This was discontinued 2 days prior to admission without immediate complication. Acutely febrile the morning of admission with worsening pelvic pressure/pain  Patient admitted with worsening abdominal pain, meeting criteria for sepsis secondary to UTI improving with antibiotics. Foley to remain in place until outpatient follow up with urology. Post foley insertion pain improving somewhat on new regimen as outlined below. Continue antibiotics until complete - follow up with urology for titration of medications below. Otherwise stable and agreeable for discharge home.  Discharge Diagnoses:  Principal Problem:   Sepsis secondary to UTI [facility]) Active Problems:   Essential hypertension   GERD   Transaminitis    Discharge Instructions   Allergies as of 01/31/2023   No Known Allergies      Medication List     STOP taking these medications    aspirin 81 MG tablet       TAKE these medications    AZO URINARY PAIN PO Take 2 tablets by mouth as needed (urinary pain).   ciprofloxacin 500 MG tablet Commonly known as: Cipro Take 1 tablet (500 mg total) by mouth 2 (two) times  daily for 7 days.   finasteride 5 MG tablet Commonly known as: PROSCAR Take 1 tablet (5 mg total) by mouth daily.   hyoscyamine 0.125 MG SL tablet Commonly known as: LEVSIN SL Place 2 tablets (0.25 mg total) under the tongue every 4 (four) hours as needed for cramping.   ibuprofen 600 MG tablet Commonly known as: ADVIL Take 1 tablet (600 mg total) by mouth every 4 (four) hours as needed for fever, headache, cramping, moderate pain or mild pain.   lidocaine 2 % jelly Commonly known as: XYLOCAINE Place 1 Application into the urethra every 8 (eight) hours as needed (pain/irritation).   losartan 50 MG tablet Commonly known as: COZAAR Take 1 tablet (50 mg total) by mouth daily.   MAGNESIUM PO Take 1 tablet by mouth at bedtime.   metoprolol succinate 25 MG 24 hr tablet Commonly known as: Toprol XL Take 1 tablet (25 mg total) by mouth daily.   multivitamin tablet Take 1 tablet by mouth at bedtime.   ondansetron 4 MG tablet Commonly known as: ZOFRAN Take 1 tablet (4 mg total) by mouth every 6 (six) hours as needed for nausea.   rosuvastatin 10 MG tablet Commonly known as: CRESTOR Take 1 tablet (10 mg total) by mouth daily.   tamsulosin 0.4 MG Caps capsule Commonly known as: FLOMAX Take 1 capsule (0.4 mg total) by mouth daily at bedtime. What changed: how much to take   tamsulosin 0.4 MG Caps capsule Commonly known as: FLOMAX Take 2 capsules (0.8 mg total) by mouth at bedtime. What changed: Another medication with the same name was changed. Make sure you  understand how and when to take each.   traMADol 50 MG tablet Commonly known as: ULTRAM Take 2 tablets (100 mg total) by mouth every 6 (six) hours as needed for up to 7 days for moderate pain.       ASK your doctor about these medications    cephALEXin 500 MG capsule Commonly known as: KEFLEX Take 1 capsule (500 mg total) by mouth 2 (two) times daily for 7 days. Ask about: Should I take this medication?         Follow-up Information     [facility]. Schedule an appointment as soon as possible for a visit in 2 days.   Contact information: [address] [place] [address] [phone]               No Known Allergies  Consultations: Urology   Procedures/Studies: DG Chest 2 View  Result Date: 01/27/2023 CLINICAL DATA:  57 year old male with fever and urinary retention. EXAM: CHEST - 2 VIEW COMPARISON:  CT Abdomen and Pelvis 0730 hours today. Chest radiographs 07/30/2008. FINDINGS: Lung volumes and mediastinal contours are normal. Visualized tracheal air column is within normal limits. Right lung calcified granuloma is less apparent since 2010. Otherwise both lungs appear clear. No pneumothorax or pleural effusion. Paucity of bowel gas in the upper abdomen. Previous cervical ACDF. No acute osseous abnormality identified. IMPRESSION: No acute cardiopulmonary abnormality. Electronically Signed   By: Odessa Fleming M.D.   On: 01/27/2023 07:59   CT Renal Stone Study  Result Date: 01/27/2023 CLINICAL DATA:  57 year old male with abdomen and flank pain. Fever and chills. Urinary retention. History of kidney stones. EXAM: CT ABDOMEN AND PELVIS WITHOUT CONTRAST TECHNIQUE: Multidetector CT imaging of the abdomen and pelvis was performed following the standard protocol without IV contrast. RADIATION DOSE REDUCTION: This exam was performed according to the departmental dose-optimization program which includes automated exposure control, adjustment of the mA and/or kV according to patient size and/or use of iterative reconstruction technique. COMPARISON:  None. FINDINGS: Lower chest: Negative. Hepatobiliary: Small but circumscribed low-density area in the right hepatic lobe series 2, image 10 has simple fluid density and is most compatible with benign cyst (no follow-up imaging recommended). Otherwise negative visible noncontrast liver and gallbladder. Pancreas: Negative.  Spleen: Negative. Adrenals/Urinary Tract: Normal adrenal glands. Punctate right lower pole nephrolithiasis on coronal image 37. No hydronephrosis. Symmetric pararenal space stranding, nonspecific. No hydro. Bladder decompressed by Foley catheter. Stomach/Bowel: Large bowel redundancy and retained stool. Normal retrocecal appendix. No large bowel inflammation. Nondilated small bowel, stomach. Negative duodenum. No free air or free fluid. Vascular/Lymphatic: Aortoiliac calcified atherosclerosis. Normal caliber abdominal aorta. Vascular patency is not evaluated in the absence of IV contrast. No lymphadenopathy identified. Reproductive: Prostatomegaly is indistinct, urethral catheter in place. Other: No pelvis free fluid. Musculoskeletal: Negative; occasional benign pelvic bone islands such as series 2, image 79. IMPRESSION: 1. Bladder decompressed by Foley catheter. Indistinct Prostatomegaly. Punctate right nephrolithiasis with no evidence of renal obstruction. Consider prostate related bladder obstruction. 2. No other acute or inflammatory process identified in the noncontrast abdomen or pelvis. Retained stool.  Aortic Atherosclerosis (ICD10-I70.0). Electronically Signed   By: Odessa Fleming M.D.   On: 01/27/2023 07:55     Subjective: No acute issues/events overnight - pain well controlled   Discharge Exam: Vitals:   01/30/23 2005 01/31/23 0500  BP: 129/82 121/73  Pulse: 61 (!) 44  Resp: 16 16  Temp: 98.1 F (36.7 C) (!) 97.4 F (36.3  C)  SpO2: 99% 100%   Vitals:   01/29/23 1938 01/30/23 0625 01/30/23 2005 01/31/23 0500  BP: 132/76 (!) 155/97 129/82 121/73  Pulse: (!) 52 (!) 57 61 (!) 44  Resp: 20 20 16 16   Temp: 98.4 F (36.9 C) 98 F (36.7 C) 98.1 F (36.7 C) (!) 97.4 F (36.3 C)  TempSrc: Oral Oral    SpO2: 97% 100% 99% 100%  Weight:      Height:        General: Pt is alert, awake, not in acute distress Cardiovascular: RRR, S1/S2 +, no rubs, no gallops Respiratory: CTA bilaterally, no  wheezing, no rhonchi Abdominal: Soft, NT, ND, bowel sounds +; foley clean/dry/intact Extremities: no edema, no cyanosis    The results of significant diagnostics from this hospitalization (including imaging, microbiology, ancillary and laboratory) are listed below for reference.     Microbiology: Recent Results (from the past 240 hour(s))  Urine Culture     Status: Abnormal   Collection Time: 01/22/23 11:36 AM   Specimen: Urine, Random  Result Value Ref Range Status   Specimen Description   Final    URINE, RANDOM Performed at [facility], [address], [place], [place] [address]    Special Requests   Final    NONE Reflexed from [id number] Performed at [facility], [address], [place], [place] [address]    Culture >=100,000 COLONIES/mL ESCHERICHIA COLI (A)  Final   Report Status 01/24/2023 FINAL  Final   Organism ID, Bacteria ESCHERICHIA COLI (A)  Final      Susceptibility   Escherichia coli - MIC*    AMPICILLIN >=32 RESISTANT Resistant     CEFAZOLIN <=4 SENSITIVE Sensitive     CEFEPIME <=0.12 SENSITIVE Sensitive     CEFTRIAXONE <=0.25 SENSITIVE Sensitive     CIPROFLOXACIN <=0.25 SENSITIVE Sensitive     GENTAMICIN <=1 SENSITIVE Sensitive     IMIPENEM <=0.25 SENSITIVE Sensitive     NITROFURANTOIN <=16 SENSITIVE Sensitive     TRIMETH/SULFA <=20 SENSITIVE Sensitive     AMPICILLIN/SULBACTAM 8 SENSITIVE Sensitive     PIP/TAZO <=4 SENSITIVE Sensitive     * >=100,000 COLONIES/mL ESCHERICHIA COLI  Blood culture (routine x 2)     Status: None (Preliminary result)   Collection Time: 01/27/23  6:50 AM   Specimen: BLOOD  Result Value Ref Range Status   Specimen Description   Final    BLOOD RIGHT ANTECUBITAL Performed at [facility], [address] [place], [place] [address]    Special Requests   Final    BOTTLES DRAWN AEROBIC AND ANAEROBIC Blood Culture results may not be optimal due to an excessive volume of blood  received in culture bottles Performed at [facility], [address] [place], [place] [address]    Culture   Final    NO GROWTH 4 DAYS Performed at [facility], [address] [place], [place] [address]    Report Status PENDING  Incomplete  Urine Culture     Status: Abnormal   Collection Time: 01/27/23  7:03 AM   Specimen: Urine, Clean Catch  Result Value Ref Range Status   Specimen Description   Final    URINE, CLEAN CATCH Performed at [facility], [address] [place], [place] [address]    Special Requests   Final    NONE Performed at [facility], [address], [place], [place] [address]    Culture (A)  Final    <  10,000 COLONIES/mL INSIGNIFICANT GROWTH Performed at [facility], [address], [place], [place] [address]    Report Status 01/28/2023 FINAL  Final  Blood culture (routine x 2)     Status: None (Preliminary result)   Collection Time: 01/27/23  7:45 AM   Specimen: BLOOD  Result Value Ref Range Status   Specimen Description   Final    BLOOD LEFT ANTECUBITAL Performed at [facility], [address] [place], [place] [address]    Special Requests   Final    BOTTLES DRAWN AEROBIC AND ANAEROBIC Blood Culture results may not be optimal due to an excessive volume of blood received in culture bottles Performed at [facility], [address], [place], [place] [address]    Culture   Final    NO GROWTH 4 DAYS Performed at [facility], [address], [place], [place] [address]    Report Status PENDING  Incomplete  SARS Coronavirus 2 by RT PCR (hospital order, performed in [facility] hospital lab) *cepheid single result test*     Status: None   Collection Time: 01/27/23  7:45 AM   Specimen: Nasal Swab  Result Value Ref Range Status   SARS Coronavirus 2 by RT PCR NEGATIVE NEGATIVE Final    Comment: (NOTE) SARS-CoV-2 target nucleic acids  are NOT DETECTED.  The SARS-CoV-2 RNA is generally detectable in upper and lower respiratory specimens during the acute phase of infection. The lowest concentration of SARS-CoV-2 viral copies this assay can detect is 250 copies / mL. A negative result does not preclude SARS-CoV-2 infection and should not be used as the sole basis for treatment or other patient management decisions.  A negative result may occur with improper specimen collection / handling, submission of specimen other than nasopharyngeal swab, presence of viral mutation(s) within the areas targeted by this assay, and inadequate number of viral copies (<250 copies / mL). A negative result must be combined with clinical observations, patient history, and epidemiological information.  Fact Sheet for Patients:   RoadLapTop.co.za  Fact Sheet for Healthcare Providers: http://kim-miller.com/  This test is not yet approved or  cleared by the [place] FDA and has been authorized for detection and/or diagnosis of SARS-CoV-2 by FDA under an Emergency Use Authorization (EUA).  This EUA will remain in effect (meaning this test can be used) for the duration of the COVID-19 declaration under Section 564(b)(1) of the Act, 21 U.S.C. section 360bbb-3(b)(1), unless the authorization is terminated or revoked sooner.  Performed at [facility], [address], [place], [place] [address]      Labs: BNP (last 3 results) No results for input(s): "BNP" in the last 8760 hours. Basic Metabolic Panel: Recent Labs  Lab 01/27/23 0745 01/28/23 0532 01/29/23 0522 01/30/23 0833 01/31/23 0521  NA 140 134* 138 138 138  K 4.4 3.5 4.0 3.9 4.1  CL 105 104 107 107 104  CO2 25 20* 20* 21* 23  GLUCOSE 114* 116* 104* 171* 99  BUN 16 14 20 16 16   CREATININE 1.12 1.15 1.09 1.10 0.93  CALCIUM 9.2 8.2* 8.7* 8.4* 8.8*   Liver Function Tests: Recent Labs  Lab 01/27/23 0745  01/28/23 0532 01/29/23 0522 01/30/23 0833 01/31/23 0521  AST 73* 73* 114* 118* 57*  ALT 134* 134* 207* 317* 245*  ALKPHOS 101 101 104 107 95  BILITOT 0.8 0.8 0.6 0.5 0.5  PROT 7.1 5.8* 6.1* 6.1* 6.2*  ALBUMIN 4.1 2.9* 3.0* 2.9* 3.0*  Recent Labs  Lab 01/27/23 0745  LIPASE 15   No results for input(s): "AMMONIA" in the last 168 hours. CBC: Recent Labs  Lab 01/27/23 0745 01/28/23 0532 01/29/23 0522 01/31/23 0521  WBC 11.0* 12.4* 9.3 7.2  NEUTROABS 8.6* 9.9* 6.7  --   HGB 14.0 12.1* 12.3* 12.5*  HCT 41.2 35.9* 37.8* 39.5  MCV 85.1 87.8 88.3 90.4  PLT 216 201 223 275   Cardiac Enzymes: No results for input(s): "CKTOTAL", "CKMB", "CKMBINDEX", "TROPONINI" in the last 168 hours. BNP: Invalid input(s): "POCBNP" CBG: No results for input(s): "GLUCAP" in the last 168 hours. D-Dimer No results for input(s): "DDIMER" in the last 72 hours. Hgb A1c No results for input(s): "HGBA1C" in the last 72 hours. Lipid Profile No results for input(s): "CHOL", "HDL", "LDLCALC", "TRIG", "CHOLHDL", "LDLDIRECT" in the last 72 hours. Thyroid function studies No results for input(s): "TSH", "T4TOTAL", "T3FREE", "THYROIDAB" in the last 72 hours.  Invalid input(s): "FREET3" Anemia work up No results for input(s): "VITAMINB12", "FOLATE", "FERRITIN", "TIBC", "IRON", "RETICCTPCT" in the last 72 hours. Urinalysis    Component Value Date/Time   COLORURINE YELLOW 01/27/2023 0704   APPEARANCEUR HAZY (A) 01/27/2023 0704   LABSPEC 1.020 01/27/2023 0704   PHURINE 6.0 01/27/2023 0704   GLUCOSEU NEGATIVE 01/27/2023 0704   HGBUR LARGE (A) 01/27/2023 0704   BILIRUBINUR NEGATIVE 01/27/2023 0704   KETONESUR NEGATIVE 01/27/2023 0704   PROTEINUR TRACE (A) 01/27/2023 0704   NITRITE NEGATIVE 01/27/2023 0704   LEUKOCYTESUR MODERATE (A) 01/27/2023 0704   Sepsis Labs Recent Labs  Lab 01/27/23 0745 01/28/23 0532 01/29/23 0522 01/31/23 0521  WBC 11.0* 12.4* 9.3 7.2   Microbiology Recent Results  (from the past 240 hour(s))  Urine Culture     Status: Abnormal   Collection Time: 01/22/23 11:36 AM   Specimen: Urine, Random  Result Value Ref Range Status   Specimen Description   Final    URINE, RANDOM Performed at [facility], [address] [place], [place] [address]    Special Requests   Final    NONE Reflexed from [id number] Performed at [facility], [address] [place], [place] [address]    Culture >=100,000 COLONIES/mL ESCHERICHIA COLI (A)  Final   Report Status 01/24/2023 FINAL  Final   Organism ID, Bacteria ESCHERICHIA COLI (A)  Final      Susceptibility   Escherichia coli - MIC*    AMPICILLIN >=32 RESISTANT Resistant     CEFAZOLIN <=4 SENSITIVE Sensitive     CEFEPIME <=0.12 SENSITIVE Sensitive     CEFTRIAXONE <=0.25 SENSITIVE Sensitive     CIPROFLOXACIN <=0.25 SENSITIVE Sensitive     GENTAMICIN <=1 SENSITIVE Sensitive     IMIPENEM <=0.25 SENSITIVE Sensitive     NITROFURANTOIN <=16 SENSITIVE Sensitive     TRIMETH/SULFA <=20 SENSITIVE Sensitive     AMPICILLIN/SULBACTAM 8 SENSITIVE Sensitive     PIP/TAZO <=4 SENSITIVE Sensitive     * >=100,000 COLONIES/mL ESCHERICHIA COLI  Blood culture (routine x 2)     Status: None (Preliminary result)   Collection Time: 01/27/23  6:50 AM   Specimen: BLOOD  Result Value Ref Range Status   Specimen Description   Final    BLOOD RIGHT ANTECUBITAL Performed at [facility], [address] [place], [place] [address]    Special Requests   Final    BOTTLES DRAWN AEROBIC AND ANAEROBIC Blood Culture results may not be optimal due to an excessive volume of blood received in culture bottles Performed at  [facility], [address], [place], [place] [address]    Culture   Final    NO GROWTH 4 DAYS Performed at [facility], [address] [place], [place] [address]    Report Status PENDING  Incomplete  Urine Culture     Status: Abnormal    Collection Time: 01/27/23  7:03 AM   Specimen: Urine, Clean Catch  Result Value Ref Range Status   Specimen Description   Final    URINE, CLEAN CATCH Performed at [facility], [address] [place], [place] [address]    Special Requests   Final    NONE Performed at [facility], [address] [place], [place] [address]    Culture (A)  Final    <10,000 COLONIES/mL INSIGNIFICANT GROWTH Performed at [facility], [address], [place], [place] [address]    Report Status 01/28/2023 FINAL  Final  Blood culture (routine x 2)     Status: None (Preliminary result)   Collection Time: 01/27/23  7:45 AM   Specimen: BLOOD  Result Value Ref Range Status   Specimen Description   Final    BLOOD LEFT ANTECUBITAL Performed at [facility], [address] [place], [place] [address]    Special Requests   Final    BOTTLES DRAWN AEROBIC AND ANAEROBIC Blood Culture results may not be optimal due to an excessive volume of blood received in culture bottles Performed at [facility], [address] [place], [place] [address]    Culture   Final    NO GROWTH 4 DAYS Performed at [facility], [address] [place], [place] [address]    Report Status PENDING  Incomplete  SARS Coronavirus 2 by RT PCR (hospital order, performed in [facility] hospital lab) *cepheid single result test*     Status: None   Collection Time: 01/27/23  7:45 AM   Specimen: Nasal Swab  Result Value Ref Range Status   SARS Coronavirus 2 by RT PCR NEGATIVE NEGATIVE Final    Comment: (NOTE) SARS-CoV-2 target nucleic acids are NOT DETECTED.  The SARS-CoV-2 RNA is generally detectable in upper and lower respiratory specimens during the acute phase of infection. The lowest concentration of SARS-CoV-2 viral copies this assay can detect is 250 copies / mL. A negative result does not preclude SARS-CoV-2 infection and should not be  used as the sole basis for treatment or other patient management decisions.  A negative result may occur with improper specimen collection / handling, submission of specimen other than nasopharyngeal swab, presence of viral mutation(s) within the areas targeted by this assay, and inadequate number of viral copies (<250 copies / mL). A negative result must be combined with clinical observations, patient history, and epidemiological information.  Fact Sheet for Patients:   RoadLapTop.co.za  Fact Sheet for Healthcare Providers: http://kim-miller.com/  This test is not yet approved or  cleared by the [place] FDA and has been authorized for detection and/or diagnosis of SARS-CoV-2 by FDA under an Emergency Use Authorization (EUA).  This EUA will remain in effect (meaning this test can be used) for the duration of the COVID-19 declaration under Section 564(b)(1) of the Act, 21 U.S.C. section 360bbb-3(b)(1), unless the authorization is terminated or revoked sooner.  Performed at [facility], [address], [place], [place] [address]      Time coordinating discharge: Over 30 minutes  SIGNED:   Azucena Fallen, DO [facility] 01/31/2023,  1:04 PM Pager   If 7PM-7AM, please contact night-coverage [email or web address]

## 2023-02-01 ENCOUNTER — Telehealth: Payer: Self-pay

## 2023-02-01 ENCOUNTER — Other Ambulatory Visit (HOSPITAL_COMMUNITY): Payer: Self-pay

## 2023-02-01 LAB — CULTURE, BLOOD (ROUTINE X 2)
Culture: NO GROWTH
Culture: NO GROWTH

## 2023-02-01 NOTE — Transitions of Care (Post Inpatient/ED Visit) (Signed)
02/01/2023  Name: Eugene Brown MRN: [id number] DOB: 04-17-1966  Today's TOC FU Call Status: Today's TOC FU Call Status:: Successful TOC FU Call Completed TOC FU Call Complete Date: 02/01/23 Patient's Name and Date of Birth confirmed.  Transition Care Management Follow-up Telephone Call Date of Discharge: 01/31/23 Discharge Facility: [facility]) Type of Discharge: Inpatient Admission Primary Inpatient Discharge Diagnosis:: urine retention How have you been since you were released from the hospital?: Better Any questions or concerns?: No  Items Reviewed: Did you receive and understand the discharge instructions provided?: Yes Medications obtained,verified, and reconciled?: Yes (Medications Reviewed) Any new allergies since your discharge?: No Dietary orders reviewed?: Yes Do you have support at home?: No  Medications Reviewed Today: Medications Reviewed Today     Reviewed by Karena Addison, LPN (Licensed Practical Nurse) on 02/01/23 at 1152  Med List Status: <None>   Medication Order Taking? Sig Documenting Provider Last Dose Status Informant  ciprofloxacin (CIPRO) 500 MG tablet [id number]  Take 1 tablet (500 mg total) by mouth 2 (two) times daily for 5 doses. Azucena Fallen, MD  Active   finasteride (PROSCAR) 5 MG tablet [id number] No Take 1 tablet (5 mg total) by mouth daily.  01/27/2023 Active Self, Pharmacy Records  hyoscyamine (LEVSIN SL) 0.125 MG SL tablet [id number]  Place 2 tablets (0.25 mg total) under the tongue every 4 (four) hours as needed for cramping. Azucena Fallen, MD  Active   ibuprofen (ADVIL) 600 MG tablet [id number]  Take 1 tablet (600 mg total) by mouth every 4 (four) hours as needed for fever, headache, cramping, moderate pain or mild pain. Azucena Fallen, MD  Active   Lidocaine-Collagen-Aloe Vera [facility]) 2 % GEL [id number]  Place 1 Application into the urethra every 8 (eight) hours as needed (pain/irritation). Azucena Fallen,  MD  Active   losartan (COZAAR) 50 MG tablet [id number] No Take 1 tablet (50 mg total) by mouth daily. Shade Flood, MD 01/27/2023 Active Self, Pharmacy Records  MAGNESIUM PO [id number] No Take 1 tablet by mouth at bedtime. [provider] 01/26/2023 Active Self, Pharmacy Records  metoprolol succinate (TOPROL XL) 25 MG 24 hr tablet [id number] No Take 1 tablet (25 mg total) by mouth daily. Laurann Montana, PA-C 01/27/2023 Active Self, Pharmacy Records  Multiple Vitamin (MULTIVITAMIN) tablet [id number] No Take 1 tablet by mouth at bedtime. [provider] 01/26/2023 Active Self, Pharmacy Records  ondansetron (ZOFRAN) 4 MG tablet [id number]  Take 1 tablet (4 mg total) by mouth every 6 (six) hours as needed for nausea. Azucena Fallen, MD  Active   Phenazopyridine HCl (AZO URINARY PAIN PO) [id number] No Take 2 tablets by mouth as needed (urinary pain). [provider] 01/25/2023 Active Self, Pharmacy Records  rosuvastatin (CRESTOR) 10 MG tablet [id number] No Take 1 tablet (10 mg total) by mouth daily. Laurann Montana, PA-C 01/27/2023 Active Self, Pharmacy Records  tamsulosin (FLOMAX) 0.4 MG CAPS capsule [id number] No Take 1 capsule (0.4 mg total) by mouth daily at bedtime.  Patient taking differently: Take 0.8 mg by mouth at bedtime.    01/26/2023 Active Self, Pharmacy Records  tamsulosin (FLOMAX) 0.4 MG CAPS capsule [id number]  Take 2 capsules (0.8 mg total) by mouth at bedtime.   Active Self, Pharmacy Records           Med Note (CRUTHIS, CHLOE C   Thu Jan 27, 2023 10:48 AM) Unable to remove duplicate entry without DC refills.   traMADol (ULTRAM) 50 MG tablet  [id number]  Take 2 tablets (100 mg total) by mouth every 6 (six) hours as needed for up to 7 days for moderate pain. Azucena Fallen, MD  Active             Home Care and Equipment/Supplies: Were Home Health Services Ordered?: NA Any new equipment or medical supplies ordered?: NA  Functional Questionnaire: Do you need  assistance with bathing/showering or dressing?: No Do you need assistance with meal preparation?: No Do you need assistance with eating?: No Do you have difficulty maintaining continence: No Do you need assistance with getting out of bed/getting out of a chair/moving?: No Do you have difficulty managing or taking your medications?: No  Follow up appointments reviewed: PCP Follow-up appointment confirmed?: No (declined) Specialist Hospital Follow-up appointment confirmed?: Yes Date of Specialist follow-up appointment?: 02/01/23 Follow-Up Specialty Provider:: uro Do you need transportation to your follow-up appointment?: No Do you understand care options if your condition(s) worsen?: Yes-patient verbalized understanding    SIGNATURE Karena Addison, LPN [facility] Nurse Health Advisor Direct Dial [phone]

## 2023-02-02 ENCOUNTER — Other Ambulatory Visit (HOSPITAL_COMMUNITY): Payer: Self-pay

## 2023-02-02 DIAGNOSIS — N3000 Acute cystitis without hematuria: Secondary | ICD-10-CM | POA: Diagnosis not present

## 2023-02-02 DIAGNOSIS — R338 Other retention of urine: Secondary | ICD-10-CM | POA: Diagnosis not present

## 2023-02-02 MED ORDER — CIPROFLOXACIN HCL 500 MG PO TABS
500.0000 mg | ORAL_TABLET | Freq: Two times a day (BID) | ORAL | 0 refills | Status: DC
  Filled 2023-02-02 – 2023-02-03 (×2): qty 14, 7d supply, fill #0

## 2023-02-03 ENCOUNTER — Other Ambulatory Visit (HOSPITAL_BASED_OUTPATIENT_CLINIC_OR_DEPARTMENT_OTHER): Payer: Self-pay

## 2023-02-03 ENCOUNTER — Other Ambulatory Visit (HOSPITAL_COMMUNITY): Payer: Self-pay

## 2023-02-07 DIAGNOSIS — N401 Enlarged prostate with lower urinary tract symptoms: Secondary | ICD-10-CM | POA: Diagnosis not present

## 2023-02-07 DIAGNOSIS — R338 Other retention of urine: Secondary | ICD-10-CM | POA: Diagnosis not present

## 2023-02-14 ENCOUNTER — Other Ambulatory Visit (HOSPITAL_COMMUNITY): Payer: Self-pay

## 2023-02-22 ENCOUNTER — Ambulatory Visit: Payer: Commercial Managed Care - PPO | Admitting: Urology

## 2023-02-22 ENCOUNTER — Other Ambulatory Visit: Payer: Self-pay

## 2023-02-22 ENCOUNTER — Encounter: Payer: Self-pay | Admitting: Urology

## 2023-02-22 ENCOUNTER — Other Ambulatory Visit: Payer: Self-pay | Admitting: Urology

## 2023-02-22 VITALS — BP 165/92 | HR 56 | Ht 70.0 in | Wt 177.0 lb

## 2023-02-22 DIAGNOSIS — N401 Enlarged prostate with lower urinary tract symptoms: Secondary | ICD-10-CM | POA: Diagnosis not present

## 2023-02-22 DIAGNOSIS — N138 Other obstructive and reflux uropathy: Secondary | ICD-10-CM

## 2023-02-22 LAB — BLADDER SCAN AMB NON-IMAGING

## 2023-02-22 NOTE — H&P (View-Only) (Signed)
02/22/23 1:52 PM   Eugene Brown 07/28/1965 [id number]  CC: Urinary retention, UTI, elevated PSA  HPI: 57 year old male with BPH, history of retention, recent UTI/sepsis after Foley removal, referred from Dr. Pete Glatter for consideration of HOLEP.  I reviewed the outside notes extensively, history of elevated PSA to 13 in March 2023, prostate biopsy in April 2023 showed prostate volume of 130 g, and BPH in all cores.  PSA remains elevated and he underwent a prostate MRI in June 2024 which showed a PI-RADS 1 lesion, no suspicious findings, prostate volume 120g.  He developed urinary retention in August with bladder scan and inability to urinate that required a Foley catheter, passed a voiding trial in early September but then presented to the ER afterwards with fever, UTI symptoms, found to have sepsis from urinary source with incomplete bladder emptying with on PVR.  No evidence of hydronephrosis on CT scan.  He has been on maximal medical therapy with Flomax and recently finasteride.   He has bothersome urinary symptoms of frequency, urgency, nocturia, weak stream.  CVR today mildly elevated at . Aside from anxiety he is otherwise healthy.   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  PMH: Past Medical History:  Diagnosis Date   GERD (gastroesophageal reflux disease)    HTN (hypertension)    Mild dilation of ascending aorta (HCC)    Premature atrial contractions    PVC's (premature ventricular contractions)     Surgical History: Past Surgical History:  Procedure Laterality Date   CERVICAL SPINE SURGERY      Family History: Family History  Problem Relation Age of Onset   CAD Mother        CABG   Atrial fibrillation Father     Social History:  reports that he has never smoked. He has never been exposed to tobacco smoke. He has never used smokeless tobacco. He reports that he does not drink alcohol and does not use drugs.  Physical Exam: BP (!) 165/92   Pulse (!) 56    Ht 5\' 10"  (1.778 m)   Wt 177 lb (80.3 kg)   BMI 25.40 kg/m    Constitutional:  Alert and oriented, No acute distress. Cardiovascular: No clubbing, cyanosis, or edema. Respiratory: Normal respiratory effort, no increased work of breathing. GI: Abdomen is soft, nontender, nondistended, no abdominal masses   Laboratory Data: Reviewed, see HPI  Pertinent Imaging: I have personally viewed and interpreted the CT scan showing 120 g prostate with Foley in place  Assessment & Plan:   57 year old male with BPH, history of urinary retention as well as UTI, elevated PSA with negative biopsy and negative MRI, prostate measures 120 g.  We reviewed treatment options including maximal medical therapy with Flomax and finasteride with high risk of recurrent retention, worsening symptoms, permanent bladder damage, prostatic artery embolization with high risk of recurrent symptoms, as well as outlet procedures with HOLEP being the optimal treatment for prostates over 100 g  We discussed the risks and benefits of HoLEP at length.  The procedure requires general anesthesia and takes 1 to 2 hours, and a holmium laser is used to enucleate the prostate and push this tissue into the bladder.  A morcellator is then used to remove this tissue, which is sent for pathology.  The vast majority(>95%) of patients are able to discharge the same day with a catheter in place for 2 to 3 days, and will follow-up in clinic for a voiding trial.  We specifically  discussed the risks of bleeding, infection, retrograde ejaculation, temporary urgency and urge incontinence, very low risk of long-term incontinence, urethral stricture/bladder neck contracture, pathologic evaluation of prostate tissue and possible detection of prostate cancer or other malignancy, and possible need for additional procedures.  Schedule HOLEP  I spent 60 total minutes on the day of the encounter including pre-visit review of the medical record,  face-to-face time with the patient, and post visit ordering of labs/imaging/tests.  Extensive review of prior records from Dr. Pete Glatter as well as recent hospitalization, as well as reviewing CT scan as well as plan for HOLEP as well as alternative options with patient and his wife.  Legrand Rams, MD 02/22/2023  [facility] [address], [address] [place], [place] [address] [phone]

## 2023-02-22 NOTE — Progress Notes (Signed)
Surgical Physician Order Form [facility]  Dr. Legrand Rams, MD  * Scheduling expectation : Next Available  *Length of Case: 2 hours  *Clearance needed: no  *Anticoagulation Instructions: Hold all anticoagulants  *Aspirin Instructions: Hold Aspirin  *Post-op visit Date/Instructions:  1-3 day cath removal  *Diagnosis: BPH w/urinary obstruction  *Procedure:  HOLEP (40981)   Additional orders: N/A  -Admit type: OUTpatient  -Anesthesia: General  -VTE Prophylaxis Standing Order SCD's       Other:   -Standing Lab Orders Per Anesthesia    Lab other: UA&Urine Culture  -Standing Test orders EKG/Chest x-ray per Anesthesia       Test other:   - Medications:  Ancef 2gm IV  -Other orders:  N/A

## 2023-02-22 NOTE — Progress Notes (Signed)
02/22/23 1:52 PM   Eugene Brown 07/28/1965 [id number]  CC: Urinary retention, UTI, elevated PSA  HPI: 57 year old male with BPH, history of retention, recent UTI/sepsis after Foley removal, referred from Dr. Pete Glatter for consideration of HOLEP.  I reviewed the outside notes extensively, history of elevated PSA to 13 in March 2023, prostate biopsy in April 2023 showed prostate volume of 130 g, and BPH in all cores.  PSA remains elevated and he underwent a prostate MRI in June 2024 which showed a PI-RADS 1 lesion, no suspicious findings, prostate volume 120g.  He developed urinary retention in August with bladder scan and inability to urinate that required a Foley catheter, passed a voiding trial in early September but then presented to the ER afterwards with fever, UTI symptoms, found to have sepsis from urinary source with incomplete bladder emptying with on PVR.  No evidence of hydronephrosis on CT scan.  He has been on maximal medical therapy with Flomax and recently finasteride.   He has bothersome urinary symptoms of frequency, urgency, nocturia, weak stream.  CVR today mildly elevated at . Aside from anxiety he is otherwise healthy.   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  PMH: Past Medical History:  Diagnosis Date   GERD (gastroesophageal reflux disease)    HTN (hypertension)    Mild dilation of ascending aorta (HCC)    Premature atrial contractions    PVC's (premature ventricular contractions)     Surgical History: Past Surgical History:  Procedure Laterality Date   CERVICAL SPINE SURGERY      Family History: Family History  Problem Relation Age of Onset   CAD Mother        CABG   Atrial fibrillation Father     Social History:  reports that he has never smoked. He has never been exposed to tobacco smoke. He has never used smokeless tobacco. He reports that he does not drink alcohol and does not use drugs.  Physical Exam: BP (!) 165/92   Pulse (!) 56    Ht 5\' 10"  (1.778 m)   Wt 177 lb (80.3 kg)   BMI 25.40 kg/m    Constitutional:  Alert and oriented, No acute distress. Cardiovascular: No clubbing, cyanosis, or edema. Respiratory: Normal respiratory effort, no increased work of breathing. GI: Abdomen is soft, nontender, nondistended, no abdominal masses   Laboratory Data: Reviewed, see HPI  Pertinent Imaging: I have personally viewed and interpreted the CT scan showing 120 g prostate with Foley in place  Assessment & Plan:   57 year old male with BPH, history of urinary retention as well as UTI, elevated PSA with negative biopsy and negative MRI, prostate measures 120 g.  We reviewed treatment options including maximal medical therapy with Flomax and finasteride with high risk of recurrent retention, worsening symptoms, permanent bladder damage, prostatic artery embolization with high risk of recurrent symptoms, as well as outlet procedures with HOLEP being the optimal treatment for prostates over 100 g  We discussed the risks and benefits of HoLEP at length.  The procedure requires general anesthesia and takes 1 to 2 hours, and a holmium laser is used to enucleate the prostate and push this tissue into the bladder.  A morcellator is then used to remove this tissue, which is sent for pathology.  The vast majority(>95%) of patients are able to discharge the same day with a catheter in place for 2 to 3 days, and will follow-up in clinic for a voiding trial.  We specifically  discussed the risks of bleeding, infection, retrograde ejaculation, temporary urgency and urge incontinence, very low risk of long-term incontinence, urethral stricture/bladder neck contracture, pathologic evaluation of prostate tissue and possible detection of prostate cancer or other malignancy, and possible need for additional procedures.  Schedule HOLEP  I spent 60 total minutes on the day of the encounter including pre-visit review of the medical record,  face-to-face time with the patient, and post visit ordering of labs/imaging/tests.  Extensive review of prior records from Dr. Pete Glatter as well as recent hospitalization, as well as reviewing CT scan as well as plan for HOLEP as well as alternative options with patient and his wife.  Legrand Rams, MD 02/22/2023  [facility] [address], [address] [place], [place] [address] [phone]

## 2023-02-22 NOTE — Progress Notes (Signed)
holep

## 2023-02-22 NOTE — Patient Instructions (Addendum)

## 2023-02-23 ENCOUNTER — Telehealth: Payer: Self-pay

## 2023-02-23 DIAGNOSIS — N401 Enlarged prostate with lower urinary tract symptoms: Secondary | ICD-10-CM | POA: Diagnosis not present

## 2023-02-23 DIAGNOSIS — N138 Other obstructive and reflux uropathy: Secondary | ICD-10-CM | POA: Diagnosis not present

## 2023-02-23 NOTE — Telephone Encounter (Signed)
Per Dr. Richardo Hanks, Patient is to be scheduled for Holmium Laser Enucleation of the Prostate   Mr. Eugene Brown was contacted and possible surgical dates were discussed, Friday October 11th, 2024 was agreed upon for surgery.   Patient was instructed that Dr. Richardo Hanks will require them to provide a pre-op UA & CX prior to surgery. This was ordered and scheduled drop off appointment was made for 02/23/2023.    Patient was directed to call [phone] between 1-3pm the day before surgery to find out surgical arrival time.  Instructions were given not to eat or drink from midnight on the night before surgery and have a driver for the day of surgery. On the surgery day patient was instructed to enter through the Medical Mall entrance of [facility] report the Same Day Surgery desk.   Pre-Admit Testing will be in contact via phone to set up an interview with the anesthesia team to review your history and medications prior to surgery.   Reminder of this information was sent via MyChart to the patient.

## 2023-02-23 NOTE — Progress Notes (Signed)
   [facility] Surgical Posting Form  Surgery Date: Date: 03/04/2023  Surgeon: Dr. Legrand Rams, MD  Inpt ( No  )   Outpt (Yes)   Obs ( No  )   Diagnosis: N40.1, N13.8 Benign Prostatic Hyperplasia with Urinary Obstruction  -CPT: 40981  Surgery: Holmium Laser Enucleation of the Prostate  Stop Anticoagulations: Yes and also hold ASA  Cardiac/Medical/Pulmonary Clearance needed: no  *Orders entered into EPIC  Date: 02/23/23   *Case booked in EPIC  Date: 02/23/23  *Notified pt of Surgery: Date: 02/23/23  PRE-OP UA & CX: yes, will obtain at [facility] [facility] today 02/23/2023  *Placed into Prior Authorization Work Angela Nevin Date: 02/23/23  Assistant/laser/rep:No

## 2023-02-25 ENCOUNTER — Other Ambulatory Visit: Payer: Self-pay | Admitting: Family Medicine

## 2023-02-25 DIAGNOSIS — Z1212 Encounter for screening for malignant neoplasm of rectum: Secondary | ICD-10-CM

## 2023-02-25 DIAGNOSIS — Z1211 Encounter for screening for malignant neoplasm of colon: Secondary | ICD-10-CM

## 2023-02-28 ENCOUNTER — Telehealth: Payer: Self-pay | Admitting: *Deleted

## 2023-02-28 ENCOUNTER — Encounter
Admission: RE | Admit: 2023-02-28 | Discharge: 2023-02-28 | Disposition: A | Discharge status: Home / Self Care | Payer: Commercial Managed Care - PPO | Source: Ambulatory Visit | Attending: Urology | Admitting: Urology

## 2023-02-28 ENCOUNTER — Ambulatory Visit: Payer: Commercial Managed Care - PPO | Attending: Adult Health

## 2023-02-28 DIAGNOSIS — Z01818 Encounter for other preprocedural examination: Secondary | ICD-10-CM | POA: Diagnosis not present

## 2023-02-28 NOTE — Telephone Encounter (Signed)
Name: Eugene Brown  DOB: 10/28/1965  MRN: [id number]  Primary Cardiologist: Verne Carrow, MD   Preoperative team, please contact this patient and set up a phone call appointment for further preoperative risk assessment. Please obtain consent and complete medication review.  He was last seen by Ronie Spies, PA 11/08/2022. Thank you for your help.  I confirm that guidance regarding antiplatelet and oral anticoagulation therapy has been completed and, if necessary, noted below.  He is not on any anticoagulation or antiplatelet therapy.   I also confirmed the patient resides in the state of [place]. As per [facility] Medical Board telemedicine laws, the patient must reside in the state in which the provider is licensed.   Joni Reining, NP 02/28/2023, 11:12 AM [facility]

## 2023-02-28 NOTE — Patient Instructions (Signed)
Your procedure is scheduled on:03-04-23 Friday Report to the Registration Desk on the 1st floor of the Medical Mall.Then proceed to the 2nd floor Surgery Desk To find out your arrival time, please call [phone] between 1PM - 3PM on:03-03-23 Thursday If your arrival time is 6:00 am, do not arrive before that time as the Medical Mall entrance doors do not open until 6:00 am.  REMEMBER: Instructions that are not followed completely may result in serious medical risk, up to and including death; or upon the discretion of your surgeon and anesthesiologist your surgery may need to be rescheduled.  Do not eat food OR drink any liquids after midnight the night before surgery.  No gum chewing or hard candies.  One week prior to surgery: Stop Anti-inflammatories (NSAIDS) such as Advil, Aleve, Ibuprofen, Motrin, Naproxen, Naprosyn and Aspirin based products such as Excedrin, Goody's Powder, BC Powder.You may however, take Tylenol if needed for pain up until the day of surgery. Stop ANY OVER THE COUNTER supplements/vitamins NOW(02-28-23) until after surgery (Magnesium and Multivitamin)   Continue taking all prescribed medications   TAKE ONLY THESE MEDICATIONS THE MORNING OF SURGERY WITH A SIP OF WATER: -finasteride (PROSCAR)  -metoprolol succinate (TOPROL XL)  -rosuvastatin (CRESTOR)  -tamsulosin (FLOMAX)   No Alcohol for 24 hours before or after surgery.  No Smoking including e-cigarettes for 24 hours before surgery.  No chewable tobacco products for at least 6 hours before surgery.  No nicotine patches on the day of surgery.  Do not use any "recreational" drugs for at least a week (preferably 2 weeks) before your surgery.  Please be advised that the combination of cocaine and anesthesia may have negative outcomes, up to and including death. If you test positive for cocaine, your surgery will be cancelled.  On the morning of surgery brush your teeth with toothpaste and water, you may rinse  your mouth with mouthwash if you wish. Do not swallow any toothpaste or mouthwash.  Do not wear jewelry, make-up, hairpins, clips or nail polish.  For welded (permanent) jewelry: bracelets, anklets, waist bands, etc.  Please have this removed prior to surgery.  If it is not removed, there is a chance that hospital personnel will need to cut it off on the day of surgery.  Do not wear lotions, powders, or perfumes.   Do not shave body hair from the neck down 48 hours before surgery.  Contact lenses, hearing aids and dentures may not be worn into surgery.  Do not bring valuables to the hospital. [facility] is not responsible for any missing/lost belongings or valuables.   Notify your doctor if there is any change in your medical condition (cold, fever, infection).  Wear comfortable clothing (specific to your surgery type) to the hospital.  After surgery, you can help prevent lung complications by doing breathing exercises.  Take deep breaths and cough every 1-2 hours. Your doctor may order a device called an Incentive Spirometer to help you take deep breaths. When coughing or sneezing, hold a pillow firmly against your incision with both hands. This is called "splinting." Doing this helps protect your incision. It also decreases belly discomfort.  If you are being admitted to the hospital overnight, leave your suitcase in the car. After surgery it may be brought to your room.  In case of increased patient census, it may be necessary for you, the patient, to continue your postoperative care in the Same Day Surgery department.  If you are being discharged the day of  surgery, you will not be allowed to drive home. You will need a responsible individual to drive you home and stay with you for 24 hours after surgery.   If you are taking public transportation, you will need to have a responsible individual with you.  Please call the Pre-admissions Testing Dept. at [phone] if you have  any questions about these instructions.  Surgery Visitation Policy:  Patients having surgery or a procedure may have two visitors.  Children under the age of 34 must have an adult with them who is not the patient.

## 2023-02-28 NOTE — Telephone Encounter (Signed)
-----   Message from Verlee Monte sent at 02/25/2023 11:49 PM EDT ----- Regarding: Request for pre-operative cardiac clearance Request for pre-operative cardiac clearance:  1. What type of surgery is being performed?  HOLEP-LASER ENUCLEATION OF THE PROSTATE WITH MORCELLATION  2. When is this surgery scheduled?  03/04/2023  3. Type of clearance being requested (medical, pharmacy, both)? MEDICAL   4. Are there any medications that need to be held prior to surgery? NONE  5. Practice name and name of physician performing surgery?  Performing surgeon: Dr. Legrand Rams, MD Requesting clearance: Eugene Mulling, FNP-C    6. Anesthesia type (none, local, MAC, general)? GENERAL  7. What is the office phone and fax number?   Phone: [phone] Fax: [phone]  ATTENTION: Unable to create telephone message as per your standard workflow. Directed by [facility] providers to send requests for cardiac clearance to this pool for appropriate distribution to provider covering pre-operative clearances.   Eugene Mulling, MSN, APRN, FNP-C, CEN [facility]  Peri-operative Services Nurse Practitioner Phone: [phone] 02/25/23 11:49 PM

## 2023-02-28 NOTE — Telephone Encounter (Signed)
Pt has been added on ok per Joni Reining, DNP due to procedure date. Med rec and consent are done.     Patient Consent for Virtual Visit        Eugene Brown has provided verbal consent on 02/28/2023 for a virtual visit (video or telephone).   CONSENT FOR VIRTUAL VISIT FOR:  Eugene Brown  By participating in this virtual visit I agree to the following:  I hereby voluntarily request, consent and authorize [facility] and its employed or contracted physicians, physician assistants, nurse practitioners or other licensed health care professionals (the Practitioner), to provide me with telemedicine health care services (the "Services") as deemed necessary by the treating Practitioner. I acknowledge and consent to receive the Services by the Practitioner via telemedicine. I understand that the telemedicine visit will involve communicating with the Practitioner through live audiovisual communication technology and the disclosure of certain medical information by electronic transmission. I acknowledge that I have been given the opportunity to request an in-person assessment or other available alternative prior to the telemedicine visit and am voluntarily participating in the telemedicine visit.  I understand that I have the right to withhold or withdraw my consent to the use of telemedicine in the course of my care at any time, without affecting my right to future care or treatment, and that the Practitioner or I may terminate the telemedicine visit at any time. I understand that I have the right to inspect all information obtained and/or recorded in the course of the telemedicine visit and may receive copies of available information for a reasonable fee.  I understand that some of the potential risks of receiving the Services via telemedicine include:  Delay or interruption in medical evaluation due to technological equipment failure or disruption; Information transmitted may not be  sufficient (e.g. poor resolution of images) to allow for appropriate medical decision making by the Practitioner; and/or  In rare instances, security protocols could fail, causing a breach of personal health information.  Furthermore, I acknowledge that it is my responsibility to provide information about my medical history, conditions and care that is complete and accurate to the best of my ability. I acknowledge that Practitioner's advice, recommendations, and/or decision may be based on factors not within their control, such as incomplete or inaccurate data provided by me or distortions of diagnostic images or specimens that may result from electronic transmissions. I understand that the practice of medicine is not an exact science and that Practitioner makes no warranties or guarantees regarding treatment outcomes. I acknowledge that a copy of this consent can be made available to me via my patient portal [facility] MyChart), or I can request a printed copy by calling the office of [facility].    I understand that my insurance will be billed for this visit.   I have read or had this consent read to me. I understand the contents of this consent, which adequately explains the benefits and risks of the Services being provided via telemedicine.  I have been provided ample opportunity to ask questions regarding this consent and the Services and have had my questions answered to my satisfaction. I give my informed consent for the services to be provided through the use of telemedicine in my medical care

## 2023-02-28 NOTE — Telephone Encounter (Signed)
Pt has been added on ok per Joni Reining, DNP due to procedure date. Med rec and consent are done.

## 2023-02-28 NOTE — Progress Notes (Signed)
Virtual Visit via Telephone Note   Because of Beorn Portman Mccullar's co-morbid illnesses, he is at least at moderate risk for complications without adequate follow up.  This format is felt to be most appropriate for this patient at this time.  The patient did not have access to video technology/had technical difficulties with video requiring transitioning to audio format only (telephone).  All issues noted in this document were discussed and addressed.  No physical exam could be performed with this format.  Please refer to the patient's chart for his consent to telehealth for [facility].  Evaluation Performed:  Preoperative cardiovascular risk assessment _____________   Date:  02/28/2023   Patient ID:  Eugene Brown, DOB 08-Mar-1966, MRN [id number] Patient Location:  Home Provider location:   Office  Primary Care Provider:  Shade Flood, MD Primary Cardiologist:  Verne Carrow, MD  Chief Complaint / Patient Profile   57 y.o. y/o male with a h/o GERD, HTN, palpitations (PACs/PVCs), mild dilation of aorta, mild HLD He is a Doctor, general practice with [employer]  He is pending HOLEP-LASER ENUCLEATION OF THE PROSTATE WITH MORCELLATION by Dr. Richardo Hanks on 03/04/2023 and presents today for telephonic preoperative cardiovascular risk assessment.   History of Present Illness    Eugene Brown is a 57 y.o. male who presents via audio/video conferencing for a telehealth visit today.  Pt was last seen in cardiology clinic on 11/08/2022 by Ronie Spies, PA.  At that time Eugene Brown was doing well .  The patient is now pending procedure as outlined above. Since his last visit, he has done very well.  He continues on statin therapy without myalgias with good response to treatment.  He denies chest pain shortness of breath palpitations with history of PACs and PVCs.  He is very active riding stationary bike, working in his yard, walking a lot with his wife who is an Charity fundraiser.  He  reports that he has been told to hold losartan prior to the procedure by Dr. Irish Elders.  Past Medical History    Past Medical History:  Diagnosis Date   Aortic atherosclerosis (HCC)    BPH (benign prostatic hyperplasia)    GERD (gastroesophageal reflux disease)    History of kidney stones    HTN (hypertension)    Hyperlipidemia    Mild dilation of ascending aorta (HCC)    Premature atrial contractions    PVC's (premature ventricular contractions)    Sepsis secondary to UTI (HCC)    Transaminitis    Urinary retention    Past Surgical History:  Procedure Laterality Date   CERVICAL SPINE SURGERY  2000    Allergies  No Known Allergies  Home Medications    Prior to Admission medications   Medication Sig Start Date End Date Taking? Authorizing Provider  finasteride (PROSCAR) 5 MG tablet Take 1 tablet (5 mg total) by mouth daily. Patient taking differently: Take 5 mg by mouth every morning. 01/18/23     losartan (COZAAR) 50 MG tablet Take 1 tablet (50 mg total) by mouth daily. Patient taking differently: Take 50 mg by mouth every morning. 08/26/22   Shade Flood, MD  MAGNESIUM PO Take 1 tablet by mouth at bedtime.    [provider]  metoprolol succinate (TOPROL XL) 25 MG 24 hr tablet Take 1 tablet (25 mg total) by mouth daily. Patient taking differently: Take 25 mg by mouth every morning. 11/08/22   Dunn, Tacey Ruiz, PA-C  Multiple Vitamin (MULTIVITAMIN) tablet  Take 1 tablet by mouth at bedtime.    [provider]  rosuvastatin (CRESTOR) 10 MG tablet Take 1 tablet (10 mg total) by mouth daily. Patient taking differently: Take 10 mg by mouth every morning. 11/09/22 05/09/23  Dunn, Tacey Ruiz, PA-C  tamsulosin (FLOMAX) 0.4 MG CAPS capsule Take 2 capsules (0.8 mg total) by mouth at bedtime. Patient taking differently: Take 0.8 mg by mouth daily after breakfast. 01/18/23       Physical Exam    Vital Signs:  Eugene Brown does not have vital signs available for  review today.  Given telephonic nature of communication, physical exam is limited. AAOx3. NAD. Normal affect.  Speech and respirations are unlabored.  Accessory Clinical Findings    None  Assessment & Plan    1.  Preoperative Cardiovascular Risk Assessment:  According to the Revised Cardiac Risk Index (RCRI), his Perioperative Risk of Major Cardiac Event is (%): 0.9  His Functional Capacity in METs is: 8.97 according to the [facility] Activity Status Index (DASI).   The patient was advised that if he develops new symptoms prior to surgery to contact our office to arrange for a follow-up visit, and he verbalized understanding.  Therefore, based on ACC/AHA guidelines, patient would be at acceptable risk for the planned procedure without further cardiovascular testing. I will route this recommendation to the requesting party via Epic fax function.   A copy of this note will be routed to requesting surgeon.   Time:   Today, I have spent 10 minutes with the patient with telehealth technology discussing medical history, symptoms, and management plan.     Joni Reining, NP  02/28/2023, 3:23 PM

## 2023-03-02 ENCOUNTER — Encounter: Payer: Self-pay | Admitting: Urology

## 2023-03-02 NOTE — Progress Notes (Signed)
Perioperative / Anesthesia Services  Pre-Admission Testing Clinical Review / Pre-Operative Anesthesia Consult  Date: 03/02/23  Patient Demographics:  Name: Eugene Brown DOB:   [birth year] MRN:   [id number]  Planned Surgical Procedure(s):    Case: [id number] Date/Time: 03/04/23 0908   Procedure: HOLEP-LASER ENUCLEATION OF THE PROSTATE WITH MORCELLATION   Anesthesia type: General   Pre-op diagnosis: Benign Prostatic Hyperplasia with Urinary Obstruction   Location: [facility] OR ROOM 10 / [facility] ORS FOR ANESTHESIA GROUP   Surgeons: Sondra Come, MD     NOTE: Available PAT nursing documentation and vital signs have been reviewed. Clinical nursing staff has updated patient's PMH/PSHx, current medication list, and drug allergies/intolerances to ensure comprehensive history available to assist in medical decision making as it pertains to the aforementioned surgical procedure and anticipated anesthetic course. Extensive review of available clinical information personally performed. [facility] and PSH[facility] updated with any diagnoses/procedures that  may have been inadvertently omitted during his intake with the pre-admission testing department's nursing staff.  Clinical Discussion:  Eugene Brown is a 57 y.o. male who is submitted for pre-surgical anesthesia review and clearance prior to him undergoing the above procedure. Patient has never been a smoker. Pertinent PMH includes: CAD, palpitations, ascending aorta dilatation, aortic atherosclerosis, angina, HTN, HLD, granulomatous lung disease, GERD (no daily Tx), BPH with BOO, recent sepsis secondary E. coli UTI, nephrolithiasis, transaminitis, cervical DDD (s/p ACDF).  Patient is followed by cardiology Clifton James, MD). He was last seen in the cardiology clinic on 11/08/2022; notes reviewed. At the time of his clinic visit, patient doing well overall from a cardiovascular perspective. Patient denied any chest pain, shortness of breath, PND,  orthopnea, palpitations, significant peripheral edema, weakness, fatigue, vertiginous symptoms, or presyncope/syncope. Patient with a past medical history significant for cardiovascular diagnoses. Documented physical exam was grossly benign, providing no evidence of acute exacerbation and/or decompensation of the patient's known cardiovascular conditions.  Long-term cardiac event monitor study performed on 06/12/2021 revealed a predominant underlying normal sinus rhythm with rare atrial and ventricular ectopy.  There were no sustained pauses or significant arrhythmias noted.  Most recent TTE was performed on 12/03/2022 revealing a normal left ventricular systolic function with an EF of 55 to 60%.  There were no regional wall motion abnormalities.  Right ventricular size and function was normal.  There was trivial mitral valve regurgitation.  All transvalvular gradients were noted to be normal providing no evidence suggestive of valvular stenosis.  Mild dilatation of the aortic root measuring 39 mm was observed.  Coronary CTA was performed on 12/06/2022 that demonstrated an Agatston coronary artery calcium score of 169. This placed patient in the 21 percentile for age, sex, and race matched controls. Calcium depositions noted in the left main (67.2), LAD (77.2), LCx (13.9), and RCA (10.8) distributions.    Blood pressure elevated in the office at 160/90 mmHg on currently prescribed ARB (losartan) and beta-blocker (metoprolol succinate) therapies.  Patient reported that he had a stressful morning at work.  Patient is on rosuvastatin for his HLD diagnosis and ASCVD prevention.  Patient is not diabetic He does not have an OSAH diagnosis. Patient is able to complete all of his  ADL/IADLs without cardiovascular limitation.  Per the DASI, patient is able to achieve well over 4 METS of physical activity without experiencing any significant degree of angina/anginal equivalent symptoms.  No changes were made to his  medication regimen during his visit with cardiology.  Patient scheduled to follow-up with outpatient cardiology in  1 year or sooner if needed.  [name] is scheduled for [facility] ENUCLEATION OF THE PROSTATE WITH MORCELLATION on 03/04/2023 with Dr. Legrand Rams, MD.  Given patient's past medical history significant for cardiovascular diagnoses, presurgical cardiac clearance was sought by the PAT team.  Per cardiology, "according to the Revised Cardiac Risk Index (RCRI), his Perioperative Risk of Major Cardiac Event is (%): 0.9. His Functional Capacity in METs is: 8.97 according to the Duke Activity Status Index (DASI).  Therefore, based on ACC/AHA guidelines, patient would be at ACCEPTABLE risk for the planned procedure without further cardiovascular testing".   In review of his medication reconciliation, the patient is not noted to be taking any type of anticoagulation or antiplatelet therapies that would need to be held during his perioperative course.  Patient denies previous perioperative complications with anesthesia in the past.  In review his EMR, there are no records available for review pertaining to  procedural/anesthetic courses within the [facility] in the recent past.     02/22/2023    9:59 AM 01/31/2023    5:00 AM 01/30/2023    8:05 PM  Vitals with BMI  Height 5\' 10"     Weight 177 lbs    BMI 25.4    Systolic 165 121 440  Diastolic 92 73 82  Pulse 56 44 61    Providers/Specialists:   NOTE: Primary physician provider listed below. Patient may have been seen by APP or partner within same practice.   PROVIDER ROLE / SPECIALTY LAST Lise Auer, MD Urology (Surgeon) 02/22/2023  Shade Flood, MD Primary Care Provider 08/26/2022  Verne Carrow, MD Cardiology 11/08/2022; update preop APP call on 02/28/2023   Allergies:  Patient has no known allergies.  Current Home Medications:   No current facility-administered medications for this  encounter.    finasteride (PROSCAR) 5 MG tablet   losartan (COZAAR) 50 MG tablet   MAGNESIUM PO   metoprolol succinate (TOPROL XL) 25 MG 24 hr tablet   Multiple Vitamin (MULTIVITAMIN) tablet   rosuvastatin (CRESTOR) 10 MG tablet   tamsulosin (FLOMAX) 0.4 MG CAPS capsule   History:   Past Medical History:  Diagnosis Date   Aortic atherosclerosis (HCC)    Ascending aorta dilatation (HCC) 06/09/2021   a.) TTE 06/09/2021: Ao root 38 mm, asc Ao 40 mm; b.) TTE 12/03/2022: Ao root 39 mm   BPH with urinary obstruction    CAD (coronary artery disease) 12/06/2022   a.) cCTA 12/06/2022: Ca2+ = 169 (78th %'ile for age/sex/race match control)   Chest pain    DDD (degenerative disc disease), cervical    a.) s/p ACDF 1998   GERD (gastroesophageal reflux disease)    Granulomatous lung disease (HCC)    a.) CXR 04/29/2006: 8 x 6 mm RML calcefic density; b.) CXR 07/30/2008: 7 mm calcified lung nodule on the RIGHT compatible with granuloma; c.) CXR 01/27/2023: RIGHT lung calcified granuloma is less apparent since 2010   History of echocardiogram    a.) TTE 06/09/2021: EF 60-65%, no RWMAs, norm RVSF, trim MR, Ao root 38 mm, asc Ao 40 mm; b.) TTE 12/03/2022: EF 55-60%, no RWMAs, norm RVSF, triv MR, Ao root 39 mm   History of kidney stones    HTN (hypertension)    Hyperlipidemia    Palpitations (PAC/PVCs)    a.) Zio 06/12/2021: NSR with rare atrial/ventricular ectopy. no arrythmias or sustained pauses   Sepsis secondary to UTI (HCC) 01/27/2023   Transaminitis  Urinary retention    Past Surgical History:  Procedure Laterality Date   ANTERIOR CERVICAL DECOMP/DISCECTOMY FUSION N/A 1998   Family History  Problem Relation Age of Onset   CAD Mother        CABG   Atrial fibrillation Father    Social History   Tobacco Use   Smoking status: Never    Passive exposure: Never   Smokeless tobacco: Never   Tobacco comments:    no tobacco   Vaping Use   Vaping status: Never Used  Substance Use  Topics   Alcohol use: Yes    Comment: rare beer   Drug use: No    Pertinent Clinical Results:  LABS:   Lab Results  Component Value Date   WBC 7.2 01/31/2023   HGB 12.5 (L) 01/31/2023   HCT 39.5 01/31/2023   MCV 90.4 01/31/2023   PLT 275 01/31/2023   Lab Results  Component Value Date   NA 138 01/31/2023   K 4.1 01/31/2023   CO2 23 01/31/2023   GLUCOSE 99 01/31/2023   BUN 16 01/31/2023   CREATININE 0.93 01/31/2023   CALCIUM 8.8 (L) 01/31/2023   GFR 64.65 02/22/2022   EGFR 70 11/08/2022   GFRNONAA >60 01/31/2023    ECG: Date: 11/08/2022 Time ECG obtained: 0906 AM Rate: 56 bpm Rhythm: sinus bradycardia Axis (leads I and aVF): Normal Intervals: PR 152 ms. QRS 86 ms. QTc 380 ms. ST segment and T wave changes: No evidence of acute ST segment elevation or depression.   Comparison: Similar to previous tracing obtained on 06/01/2021   IMAGING / PROCEDURES: CT RENAL STONE STUDY performed on 01/27/2023 Bladder decompressed by Foley catheter.  Indistinct prostatomegaly. Consider prostate related bladder obstruction. Punctate right nephrolithiasis with no evidence of renal obstruction.   No other acute or inflammatory process identified in the noncontrast abdomen or pelvis. Retained stool.   Aortic atherosclerosis  DIAGNOSTIC RADIOGRAPHS OF CHEST performed on 01/27/2023 Lung volumes and mediastinal contours are normal.  Visualized tracheal air column is within normal limits.  Right lung calcified granuloma is less apparent since 2010. Otherwise both lungs appear clear.  No pneumothorax or pleural effusion.  Paucity of bowel gas in the upper abdomen.  Previous cervical ACDF.  No acute osseous abnormality identified.    CT CARDIAC SCORING performed on  12/06/2022 Coronary calcium score of 169. This was 78th percentile for age, race,and sex matched controls. Aortic atherosclerosis. Changes of prior granulomatous disease. No acute extracardiac abnormality  noted.  TRANSTHORACIC ECHOCARDIOGRAM performed on 12/03/2022 Left ventricular ejection fraction, by estimation, is 55 to 60%. The left ventricle has normal function. The left ventricle has no regional wall motion abnormalities. Left ventricular diastolic parameters were normal.  Right ventricular systolic function is normal. The right ventricular size is normal. Tricuspid regurgitation signal is inadequate for assessing PA pressure.  The mitral valve is normal in structure. Trivial mitral valve regurgitation. No evidence of mitral stenosis.  The aortic valve is tricuspid. Aortic valve regurgitation is not visualized. No aortic stenosis is present. Aortic dilatation noted. There is mild dilatation of the aortic root, measuring 39 mm.  The inferior vena cava is normal in size with greater than 50% respiratory variability, suggesting right atrial pressure of 3 mmHg.   LONG TERM CARDIAC EVENT MONITOR STUDY performed on 06/12/2021 Patch Wear Time:  3 days and 2 hours (2023-01-09T16:23:05-498 to 2023-01-12T18:30:29-0500) Predominant underlying sinus rhythm Rare premature atrial contractions Rare premature ventricular contractions No significant arrhythmias or sustained pauses  Impression  and Plan:  TRUNG ARDITO has been referred for pre-anesthesia review and clearance prior to him undergoing the planned anesthetic and procedural courses. Available labs, pertinent testing, and imaging results were personally reviewed by me in preparation for upcoming operative/procedural course. [facility] medical record has been updated following extensive record review and patient interview with PAT staff.   This patient has been appropriately cleared by cardiology with an overall ACCEPTABLE risk of experiencing significant perioperative cardiovascular complications. Based on clinical review performed today (03/02/23), barring any significant acute changes in the patient's overall condition, it is anticipated  that he will be able to proceed with the planned surgical intervention. Any acute changes in clinical condition may necessitate his procedure being postponed and/or cancelled. Patient will meet with anesthesia team (MD and/or CRNA) on the day of his procedure for preoperative evaluation/assessment. Questions regarding anesthetic course will be fielded at that time.   Pre-surgical instructions were reviewed with the patient during his PAT appointment, and questions were fielded to satisfaction by PAT clinical staff. He has been instructed on which medications that he will need to hold prior to surgery, as well as the ones that have been deemed safe/appropriate to take on the day of his procedure. As part of the general education provided by PAT, patient made aware both verbally and in writing, that he would need to abstain from the use of any illegal substances during his perioperative course.  He was advised that failure to follow the provided instructions could necessitate case cancellation or result in serious perioperative complications up to and including death. Patient encouraged to contact PAT and/or his surgeon's office to discuss any questions or concerns that may arise prior to surgery; verbalized understanding.   Quentin Mulling, MSN, APRN, FNP-C, CEN [facility]  Perioperative Services Nurse Practitioner Phone: [phone] Fax: [phone] 03/02/23 12:44 PM  NOTE: This note has been prepared using Dragon dictation software. Despite my best ability to proofread, there is always the potential that unintentional transcriptional errors may still occur from this process.

## 2023-03-03 ENCOUNTER — Ambulatory Visit: Payer: Commercial Managed Care - PPO | Admitting: Family Medicine

## 2023-03-03 VITALS — BP 128/84 | HR 54 | Temp 98.4°F | Ht 70.0 in | Wt 178.8 lb

## 2023-03-03 DIAGNOSIS — Z0000 Encounter for general adult medical examination without abnormal findings: Secondary | ICD-10-CM

## 2023-03-03 DIAGNOSIS — E785 Hyperlipidemia, unspecified: Secondary | ICD-10-CM | POA: Diagnosis not present

## 2023-03-03 DIAGNOSIS — F439 Reaction to severe stress, unspecified: Secondary | ICD-10-CM

## 2023-03-03 DIAGNOSIS — R338 Other retention of urine: Secondary | ICD-10-CM | POA: Diagnosis not present

## 2023-03-03 DIAGNOSIS — I10 Essential (primary) hypertension: Secondary | ICD-10-CM | POA: Diagnosis not present

## 2023-03-03 DIAGNOSIS — R7989 Other specified abnormal findings of blood chemistry: Secondary | ICD-10-CM | POA: Diagnosis not present

## 2023-03-03 DIAGNOSIS — N401 Enlarged prostate with lower urinary tract symptoms: Secondary | ICD-10-CM

## 2023-03-03 MED ORDER — ORAL CARE MOUTH RINSE: 15.0000 mL | Freq: Once | OROMUCOSAL | Status: AC

## 2023-03-03 MED ORDER — CHLORHEXIDINE GLUCONATE 0.12 % MT SOLN
15.0000 mL | Freq: Once | OROMUCOSAL | Status: AC
  Administered 2023-03-04: 15 mL via OROMUCOSAL

## 2023-03-03 MED ORDER — FAMOTIDINE 20 MG PO TABS
20.0000 mg | ORAL_TABLET | Freq: Once | ORAL | Status: AC
  Administered 2023-03-04: 20 mg via ORAL

## 2023-03-03 MED ORDER — LACTATED RINGERS IV SOLN: INTRAVENOUS | Status: DC

## 2023-03-03 MED ORDER — CEFAZOLIN SODIUM-DEXTROSE 2-4 GM/100ML-% IV SOLN
2.0000 g | INTRAVENOUS | Status: AC
  Administered 2023-03-04: 2 g via INTRAVENOUS

## 2023-03-03 NOTE — Progress Notes (Signed)
Subjective:  Patient ID: Eugene Brown, male    DOB: 02/17/1966  Age: 57 y.o. MRN: [id number]  CC:  Chief Complaint  Patient presents with   Annual Exam    Pt is having surgery tomorrow a holup surgery for prostate     HPI BLY BAE presents for Annual Exam Urology, Dr. Richardo Hanks, Dr. Cardell Peach, recent urinary retention.Successful trial of void when seen by Anne Fu, NP on September 16.  BPH with LUTS, referred to Dr. Naomie Dean for HoLEP procedure.  Was continued on tamsulosin and finasteride, 2 remaining days of Cipro at that time.  Times a day timed voiding and good hydration recommended.  ER precautions for recurrent retention symptoms discussed.  Plan for HoLEP procedure tomorrow. Cardiology, Dr. Clifton James, with history of PACs, PVCs, mild dilation of aorta, hypertension, hyperlipidemia.  Preoperative evaluation on October 7.  RCRI risk of 0.9%.  Functional capacity 8.97 METS with [facility] activity status index.  Acceptable risk for planned procedure without further cardiovascular testing.  Difficult past month with recent health changes. Feeling more anxiety with these issues, but no preceding anxiety/depression symptoms. Daughter was up in [place] with recent floods and trouble with contact for 4 days, but she is ok.   Anxiety not affecting sleep. Urinary issues/prostate has been issue.   Hypertension: Toprol-XL 25 mg daily, losartan 50 mg daily.Elevated TSH in 2023 normal on repeat.  Meds discussed in April, prior fatigue with beta-blocker had resolved. Losartan on hold for surgery. No new med side effects.  Home readings:  BP Readings from Last 3 Encounters:  03/04/23 (!) 147/80  03/03/23 128/84  02/22/23 (!) 165/92   Lab Results  Component Value Date   CREATININE 0.93 01/31/2023   Hyperlipidemia: With prior ASCVD risk score of 9.6%.  Started on Crestor 10 mg daily by cardiology.  Most recent LDL 59 in August.  Significant improvement from previous reading in June. Elevated  LFTs at time of sepsis secondary to UTI.   Last ate 5hrs ago Lab Results  Component Value Date   CHOL 129 01/17/2023   HDL 56 01/17/2023   LDLCALC 59 01/17/2023   LDLDIRECT 160 (H) 11/08/2022   TRIG 66 01/17/2023   CHOLHDL 2.3 01/17/2023   Lab Results  Component Value Date   ALT 245 (H) 01/31/2023   AST 57 (H) 01/31/2023   ALKPHOS 95 01/31/2023   BILITOT 0.5 01/31/2023       03/03/2023    3:12 PM 08/26/2022    3:35 PM 02/18/2022    3:46 PM 08/20/2021    9:34 AM 11/10/2017    5:50 PM  Depression screen PHQ 2/9  Decreased Interest 0 0 0 3 0  Down, Depressed, Hopeless 0 0 0 0 0  PHQ - 2 Score 0 0 0 3 0  Altered sleeping 0 0 0 0 0  Tired, decreased energy 0 0 0 0 0  Change in appetite 0 0 0 0 0  Feeling bad or failure about yourself  0 0 0 0 0  Trouble concentrating 0 0 0 0 0  Moving slowly or fidgety/restless 0 0 0 0 0  Suicidal thoughts 0 0 0 0 0  PHQ-9 Score 0 0 0 3 0  Difficult doing work/chores  Not difficult at all       Health Maintenance  Topic Date Due   COVID-19 Vaccine (1) Never done   Hepatitis C Screening  Never done   DTaP/Tdap/Td (1 - Tdap) Never done   Zoster Vaccines-  Shingrix (1 of 2) Never done   Fecal DNA (Cologuard)  Never done   INFLUENZA VACCINE  12/23/2022   HIV Screening  Completed   HPV VACCINES  Aged Out  Colon CA screening - ordered prior, new kit at home - plans on in next month.  Prostate: followed by urology with procedure tomorrow.     Immunization History  Administered Date(s) Administered   Influenza Inj Mdck Quad Pf 02/12/2022  Flu vaccine last week with work - [employer].  Covid booster - updated version through pharmacy recommended.  Shingrix - recommended - considering.  Tdap - within past 5 yrs.   No results found. Optho - yearly, glasses. 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 No other eye d/o.   Dental: every 6 months,.   Alcohol:none  Tobacco: none  Exercise: had increased to daily exercise prior to recent illness - on hold  until after surgery recovery. Off bike for 6 weeks.    History Patient Active Problem List   Diagnosis Date Noted   Sepsis secondary to UTI (HCC) 01/27/2023   Transaminitis 01/27/2023   PREMATURE VENTRICULAR CONTRACTIONS 08/27/2008   Essential hypertension 08/20/2008   GERD 08/20/2008   PALPITATIONS 08/02/2008   CHEST PAIN-UNSPECIFIED 08/02/2008   Past Medical History:  Diagnosis Date   Aortic atherosclerosis (HCC)    Ascending aorta dilatation (HCC) 06/09/2021   a.) TTE 06/09/2021: Ao root 38 mm, asc Ao 40 mm; b.) TTE 12/03/2022: Ao root 39 mm   BPH with urinary obstruction    CAD (coronary artery disease) 12/06/2022   a.) cCTA 12/06/2022: Ca2+ = 169 (78th %'ile for age/sex/race match control)   Chest pain    DDD (degenerative disc disease), cervical    a.) s/p ACDF 1998   GERD (gastroesophageal reflux disease)    Granulomatous lung disease (HCC)    a.) CXR 04/29/2006: 8 x 6 mm RML calcefic density; b.) CXR 07/30/2008: 7 mm calcified lung nodule on the RIGHT compatible with granuloma; c.) CXR 01/27/2023: RIGHT lung calcified granuloma is less apparent since 2010   History of echocardiogram    a.) TTE 06/09/2021: EF 60-65%, no RWMAs, norm RVSF, trim MR, Ao root 38 mm, asc Ao 40 mm; b.) TTE 12/03/2022: EF 55-60%, no RWMAs, norm RVSF, triv MR, Ao root 39 mm   History of kidney stones    HTN (hypertension)    Hyperlipidemia    Palpitations (PAC/PVCs)    a.) Zio 06/12/2021: NSR with rare atrial/ventricular ectopy. no arrythmias or sustained pauses   Sepsis secondary to UTI (HCC) 01/27/2023   Transaminitis    Urinary retention    Past Surgical History:  Procedure Laterality Date   ANTERIOR CERVICAL DECOMP/DISCECTOMY FUSION N/A 1998   No Known Allergies Prior to Admission medications   Medication Sig Start Date End Date Taking? Authorizing Provider  finasteride (PROSCAR) 5 MG tablet Take 1 tablet (5 mg total) by mouth daily. Patient taking differently: Take 5 mg by mouth  every morning. 01/18/23  Yes   losartan (COZAAR) 50 MG tablet Take 1 tablet (50 mg total) by mouth daily. Patient taking differently: Take 50 mg by mouth every morning. 08/26/22  Yes Shade Flood, MD  MAGNESIUM PO Take 1 tablet by mouth at bedtime.   Yes [provider]  metoprolol succinate (TOPROL XL) 25 MG 24 hr tablet Take 1 tablet (25 mg total) by mouth daily. Patient taking differently: Take 25 mg by mouth every morning. 11/08/22  Yes Dunn, Tacey Ruiz, PA-C  Multiple Vitamin (  MULTIVITAMIN) tablet Take 1 tablet by mouth at bedtime.   Yes [provider]  rosuvastatin (CRESTOR) 10 MG tablet Take 1 tablet (10 mg total) by mouth daily. Patient taking differently: Take 10 mg by mouth every morning. 11/09/22 05/09/23 Yes Dunn, Dayna N, PA-C  tamsulosin (FLOMAX) 0.4 MG CAPS capsule Take 2 capsules (0.8 mg total) by mouth at bedtime. Patient taking differently: Take 0.8 mg by mouth daily after breakfast. 01/18/23  Yes    Social History   Socioeconomic History   Marital status: Married    Spouse name: Not on file   Number of children: 2   Years of education: Not on file   Highest education level: Master's degree (e.g., MA, MS, MEng, MEd, MSW, MBA)  Occupational History   Occupation: Doctor, general practice at [employer]  Tobacco Use   Smoking status: Never    Passive exposure: Never   Smokeless tobacco: Never   Tobacco comments:    no tobacco   Vaping Use   Vaping status: Never Used  Substance and Sexual Activity   Alcohol use: Yes    Comment: rare beer   Drug use: No   Sexual activity: Yes  Other Topics Concern   Not on file  Social History Narrative   Married, 2 children.    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    No significant caffeine use; no otc supplements    Social Determinants of Health   Financial Resource Strain: Low Risk  (08/22/2022)   Overall Financial Resource Strain (CARDIA)    Difficulty of Paying Living Expenses: Not very hard  Food Insecurity:  No Food Insecurity (01/27/2023)   Hunger Vital Sign    Worried About Running Out of Food in the Last Year: Never true    Ran Out of Food in the Last Year: Never true  Transportation Needs: No Transportation Needs (01/27/2023)   PRAPARE - Administrator, Civil Service (Medical): No    Lack of Transportation (Non-Medical): No  Physical Activity: Insufficiently Active (08/22/2022)   Exercise Vital Sign    Days of Exercise per Week: 1 day    Minutes of Exercise per Session: 10 min  Stress: No Stress Concern Present (08/22/2022)   [employer] of Occupational Health - Occupational Stress Questionnaire    Feeling of Stress : Only a little  Social Connections: Socially Integrated (08/22/2022)   Social Connection and Isolation Panel [NHANES]    Frequency of Communication with Friends and Family: Three times a week    Frequency of Social Gatherings with Friends and Family: Once a week    Attends Religious Services: More than 4 times per year    Active Member of [employer] or Organizations: Yes    Attends Banker Meetings: 1 to 4 times per year    Marital Status: Married  Catering manager Violence: Not At Risk (01/27/2023)   Humiliation, Afraid, Rape, and Kick questionnaire    Fear of Current or Ex-Partner: No    Emotionally Abused: No    Physically Abused: No    Sexually Abused: No    Review of Systems 13 point review of systems per patient health survey noted.  Negative other than as indicated above or in HPI.    Objective:   Vitals:   03/03/23 1509  BP: 128/84  Pulse: (!) 54  Temp: 98.4 F (36.9 C)  TempSrc: Temporal  SpO2: 99%  Weight: 178 lb 12.8 oz (81.1 kg)  Height: 5\' 10"  (1.778  m)     Physical Exam Vitals reviewed.  Constitutional:      Appearance: He is well-developed.  HENT:     Head: Normocephalic and atraumatic.     Right Ear: External ear normal.     Left Ear: External ear normal.  Eyes:     Conjunctiva/sclera: Conjunctivae normal.      Pupils: Pupils are equal, round, and reactive to light.  Neck:     Thyroid: No thyromegaly.  Cardiovascular:     Rate and Rhythm: Normal rate and regular rhythm.     Heart sounds: Normal heart sounds.  Pulmonary:     Effort: Pulmonary effort is normal. No respiratory distress.     Breath sounds: Normal breath sounds. No wheezing.  Abdominal:     General: There is no distension.     Palpations: Abdomen is soft.     Tenderness: There is no abdominal tenderness.  Musculoskeletal:        General: No tenderness. Normal range of motion.     Cervical back: Normal range of motion and neck supple.  Lymphadenopathy:     Cervical: No cervical adenopathy.  Skin:    General: Skin is warm and dry.  Neurological:     Mental Status: He is alert and oriented to person, place, and time.     Deep Tendon Reflexes: Reflexes are normal and symmetric.  Psychiatric:        Behavior: Behavior normal.        Assessment & Plan:  TASON LARMORE is a 57 y.o. male . Annual physical exam  - -anticipatory guidance as below in AVS, screening labs above. Health maintenance items as above in HPI discussed/recommended as applicable.   Hypertension, essential  -Monitor for changes but no med changes at this time, with surgery planned, temporary hold of losartan.   Hyperlipidemia, unspecified hyperlipidemia type - Plan: Comprehensive metabolic panel, CANCELED: Comprehensive metabolic panel  -Labs obtained, tolerating Crestor, continue same.  Benign prostatic hyperplasia with urinary retention  -Planned procedure as above, continue follow-up with urology  Elevated LFTs - Plan: Comprehensive metabolic panel, CANCELED: Comprehensive metabolic panel  -Repeat labs ordered.  Further workup depending on repeat levels.  Situational stress  -Understandably with some stress given his health changes and difficulties as above, recent hospitalization with sepsis, urinary retention.  Suspect component of adjustment  disorder versus reaction, appropriate reaction to that stress.  Declined any needs for medications at this time, handout given on adjustment disorder and managing stress but advised to let me know if temporary medication needed for anxiety symptoms.  RTC precautions given.  No orders of the defined types were placed in this encounter.  Patient Instructions  As we discussed I am ok with temporary med for anxiety if needed, but ok to hold for now and see info below on adjustment disorder, and managing stressors below.  I will recheck liver tests, good luck tomorrow!  Preventive Care 57-49 Years Old, Male Preventive care refers to lifestyle choices and visits with your health care provider that can promote health and wellness. Preventive care visits are also called wellness exams. What can I expect for my preventive care visit? Counseling During your preventive care visit, your health care provider may ask about your: Medical history, including: Past medical problems. Family medical history. Current health, including: Emotional well-being. Home life and relationship well-being. Sexual activity. Lifestyle, including: Alcohol, nicotine or tobacco, and drug use. Access to firearms. Diet, exercise, and sleep habits. Safety issues such as  seatbelt and bike helmet use. Sunscreen use. Work and work Astronomer. Physical exam Your health care provider will check your: Height and weight. These may be used to calculate your BMI (body mass index). BMI is a measurement that tells if you are at a healthy weight. Waist circumference. This measures the distance around your waistline. This measurement also tells if you are at a healthy weight and may help predict your risk of certain diseases, such as type 2 diabetes and high blood pressure. Heart rate and blood pressure. Body temperature. Skin for abnormal spots. What immunizations do I need?  Vaccines are usually given at various ages, according  to a schedule. Your health care provider will recommend vaccines for you based on your age, medical history, and lifestyle or other factors, such as travel or where you work. What tests do I need? Screening Your health care provider may recommend screening tests for certain conditions. This may include: Lipid and cholesterol levels. Diabetes screening. This is done by checking your blood sugar (glucose) after you have not eaten for a while (fasting). Hepatitis B test. Hepatitis C test. HIV (human immunodeficiency virus) test. STI (sexually transmitted infection) testing, if you are at risk. Lung cancer screening. Prostate cancer screening. Colorectal cancer screening. Talk with your health care provider about your test results, treatment options, and if necessary, the need for more tests. Follow these instructions at home: Eating and drinking  Eat a diet that includes fresh fruits and vegetables, whole grains, lean protein, and low-fat dairy products. Take vitamin and mineral supplements as recommended by your health care provider. Do not drink alcohol if your health care provider tells you not to drink. If you drink alcohol: Limit how much you have to 0-2 drinks a day. Know how much alcohol is in your drink. In the [place] one drink equals one 12 oz bottle of beer (355 mL), one 5 oz glass of wine (148 mL), or one 1 oz glass of hard liquor (44 mL). Lifestyle Brush your teeth every morning and night with fluoride toothpaste. Floss one time each day. Exercise for at least 30 minutes 5 or more days each week. Do not use any products that contain nicotine or tobacco. These products include cigarettes, chewing tobacco, and vaping devices, such as e-cigarettes. If you need help quitting, ask your health care provider. Do not use drugs. If you are sexually active, practice safe sex. Use a condom or other form of protection to prevent STIs. Take aspirin only as told by your health care provider.  Make sure that you understand how much to take and what form to take. Work with your health care provider to find out whether it is safe and beneficial for you to take aspirin daily. Find healthy ways to manage stress, such as: Meditation, yoga, or listening to music. Journaling. Talking to a trusted person. Spending time with friends and family. Minimize exposure to UV radiation to reduce your risk of skin cancer. Safety Always wear your seat belt while driving or riding in a vehicle. Do not drive: If you have been drinking alcohol. Do not ride with someone who has been drinking. When you are tired or distracted. While texting. If you have been using any mind-altering substances or drugs. Wear a helmet and other protective equipment during sports activities. If you have firearms in your house, make sure you follow all gun safety procedures. What's next? Go to your health care provider once a year for an annual wellness visit. Ask your  health care provider how often you should have your eyes and teeth checked. Stay up to date on all vaccines. This information is not intended to replace advice given to you by your health care provider. Make sure you discuss any questions you have with your health care provider. Document Revised: 11/05/2020 Document Reviewed: 11/05/2020 Elsevier Patient Education  2024 Elsevier Inc.   Managing Stress, Adult Feeling a certain amount of stress is normal. Stress helps our body and mind get ready to deal with the demands of life. Stress hormones can motivate you to do well at work and meet your responsibilities. But severe or long-term (chronic) stress can affect your mental and physical health. Chronic stress puts you at higher risk for: Anxiety and depression. Other health problems such as digestive problems, muscle aches, heart disease, high blood pressure, and stroke. What are the causes? Common causes of stress include: Demands from work, such as  deadlines, feeling overworked, or having long hours. Pressures at home, such as money issues, disagreements with a spouse, or parenting issues. Pressures from major life changes, such as divorce, moving, loss of a loved one, or chronic illness. You may be at higher risk for stress-related problems if you: Do not get enough sleep. Are in poor health. Do not have emotional support. Have a mental health disorder such as anxiety or depression. How to recognize stress Stress can make you: Have trouble sleeping. Feel sad, anxious, irritable, or overwhelmed. Lose your appetite. Overeat or want to eat unhealthy foods. Want to use drugs or alcohol. Stress can also cause physical symptoms, such as: Sore, tense muscles, especially in the shoulders and neck. Headaches. Trouble breathing. A faster heart rate. Stomach pain, nausea, or vomiting. Diarrhea or constipation. Trouble concentrating. Follow these instructions at home: Eating and drinking Eat a healthy diet. This includes: Eating foods that are high in fiber, such as beans, whole grains, and fresh fruits and vegetables. Limiting foods that are high in fat and processed sugars, such as fried or sweet foods. Do not skip meals or overeat. Drink enough fluid to keep your urine pale yellow. Alcohol use Do not drink alcohol if: Your health care provider tells you not to drink. You are pregnant, may be pregnant, or are planning to become pregnant. Drinking alcohol is a way some people try to ease their stress. This can be dangerous, so if you drink alcohol: Limit how much you have to: 0-1 drink a day for women. 0-2 drinks a day for men. Know how much alcohol is in your drink. In the [place] one drink equals one 12 oz bottle of beer (355 mL), one 5 oz glass of wine (148 mL), or one 1 oz glass of hard liquor (44 mL). Activity  Include 30 minutes of exercise in your daily schedule. Exercise is a good stress reducer. Include time in your day  for an activity that you find relaxing. Try taking a walk, going on a bike ride, reading a book, or listening to music. Schedule your time in a way that lowers stress, and keep a regular schedule. Focus on doing what is most important to get done. Lifestyle Identify the source of your stress and your reaction to it. See a therapist who can help you change unhelpful reactions. When there are stressful events: Talk about them with family, friends, or coworkers. Try to think realistically about stressful events and not ignore them or overreact. Try to find the positives in a stressful situation and not focus on the  negatives. Cut back on responsibilities at work and home, if possible. Ask for help from friends or family members if you need it. Find ways to manage stress, such as: Mindfulness, meditation, or deep breathing. Yoga or tai chi. Progressive muscle relaxation. Spending time in nature. Doing art, playing music, or reading. Making time for fun activities. Spending time with family and friends. Get support from family, friends, or spiritual resources. General instructions Get enough sleep. Try to go to sleep and get up at about the same time every day. Take over-the-counter and prescription medicines only as told by your health care provider. Do not use any products that contain nicotine or tobacco. These products include cigarettes, chewing tobacco, and vaping devices, such as e-cigarettes. If you need help quitting, ask your health care provider. Do not use drugs or smoke to deal with stress. Keep all follow-up visits. This is important. Where to find support Talk with your health care provider about stress management or finding a support group. Find a therapist to work with you on your stress management techniques. Where to find more information [employer] on Mental Illness: www.nami.org American Psychological Association: [employer]ice[employer].ca Contact a health care provider  if: Your stress symptoms get worse. You are unable to manage your stress at home. You are struggling to stop using drugs or alcohol. Get help right away if: You may be a danger to yourself or others. You have any thoughts of death or suicide. Get help right awayif you feel like you may hurt yourself or others, or have thoughts about taking your own life. Go to your nearest emergency room or: Call 911. Call the National Suicide Prevention Lifeline at (240)873-6078 or 988 in the [place] This is open 24 hours a day. Text the Crisis Text Line at 986-361-5643. Summary Feeling a certain amount of stress is normal, but severe or long-term (chronic) stress can affect your mental and physical health. Chronic stress can put you at higher risk for anxiety, depression, and other health problems such as digestive problems, muscle aches, heart disease, high blood pressure, and stroke. You may be at higher risk for stress-related problems if you do not get enough sleep, are in poor health, lack emotional support, or have a mental health disorder such as anxiety or depression. Identify the source of your stress and your reaction to it. Try talking about stressful events with family, friends, or coworkers, finding a coping method, or getting support from spiritual resources. If you need more help, talk with your health care provider about finding a support group or a mental health therapist. This information is not intended to replace advice given to you by your health care provider. Make sure you discuss any questions you have with your health care provider. Document Revised: 12/04/2020 Document Reviewed: 12/02/2020 Elsevier Patient Education  2024 Elsevier Inc.   Adjustment Disorder, Adult Adjustment disorder is a group of symptoms that can develop after a stressful life event, such as the loss of a job or a serious physical illness. The symptoms can affect how you feel, think, and act. They may also interfere  with your relationships. Adjustment disorder increases your risk of suicide and substance abuse. If adjustment disorder is not managed early, it can make medical conditions that you already have worse. If the stressful life event persists, the disorder may continue and become a persistent form of adjustment disorder. What are the causes? This condition is caused by difficulty recovering from or coping with a stressful life event. What  increases the risk? You are more likely to develop this condition if: You have had previous problems coping with life stressors. You are being treated for a long-term (chronic) illness. You are being treated for an illness that cannot be cured (terminal illness). You have a family history of mental illness. What are the signs or symptoms? Symptoms of this condition include: Behavioral symptoms such as: Trouble doing daily tasks. Reckless driving. Poor work International aid/development worker. Ignoring bills. Avoiding family and friends. Impulsive actions. Emotional symptoms such as: Sadness, depression, or crying spells. Worrying a lot, or feeling nervous or anxious. Loss of enjoyment. Feelings of loss or hopelessness. Irritability. Thoughts of suicide. Physical symptoms such as: Change in appetite or weight. Complaining of feeling sick without being ill. Feeling dazed or disconnected. Nightmares. Trouble sleeping. Symptoms of this condition start within 3 months of the stressful event. They do not last more than 6 months, unless the stressful circumstances last longer. Normal grieving after the death of a loved one is not a symptom of this condition. How is this diagnosed? To diagnose this condition, your health care provider will ask about what has happened in your life and how it has affected you. He or she may also ask about your medical history and your use of medicines, alcohol, and other substances. Your health care provider may do a physical exam and order lab tests  or other studies. You may be referred to a mental health specialist. How is this treated? Treatment options for this condition include: Counseling or talk therapy. Talk therapy is usually provided by mental health specialists. This therapy may be individual or may involve family members. Medicines. Certain medicines may help with depression, anxiety, and sleep. Support groups. These offer emotional support, advice, and guidance. They are made up of people who have had similar experiences. Observation and time. This is sometimes called watchful waiting. In this treatment, health care providers monitor your health and behavior without other treatment. Adjustment disorder sometimes gets better on its own with time. Follow these instructions at home: Take over-the-counter and prescription medicines only as told by your health care provider. Keep all follow-up visits. This is important. Contact trusted family and friends for support. Let them know what is going on with you and how they can help. Contact a health care provider if: Your symptoms do not improve in 6 months. Your symptoms get worse. Get help right away if: You have serious thoughts about hurting yourself or someone else. If you ever feel like you may hurt yourself or others, or have thoughts about taking your own life, get help right away. Go to your nearest emergency department or: Call your local emergency services (911 in the [place] Call a suicide crisis helpline, such as the National Suicide Prevention Lifeline at (765)190-3052 or 988 in the [place] This is open 24 hours a day in the [place] Text the Crisis Text Line at 959-091-7321 (in the [place] Summary Adjustment disorder is a group of symptoms that can develop after a stressful life event, such as the loss of a job or a serious physical illness. The symptoms can affect how you feel, think, and act. They may interfere with your relationships. Symptoms of this condition start within 3 months  of the stressful event. They do not last more than 6 months, unless the stressful circumstances last longer. Treatment may include talk therapy, medicines, participation in a support group, or observation to see if symptoms improve. Contact your health care provider if your symptoms get  worse or do not improve in 6 months. If you ever feel like you may hurt yourself or others, or have thoughts about taking your own life, get help right away. This information is not intended to replace advice given to you by your health care provider. Make sure you discuss any questions you have with your health care provider. Document Revised: 12/03/2020 Document Reviewed: 09/21/2019 Elsevier Patient Education  2024 Elsevier Inc.     Signed,   Meredith Staggers, MD [facility], [facility] 03/04/23 3:13 PM

## 2023-03-03 NOTE — Patient Instructions (Addendum)
As we discussed I am ok with temporary med for anxiety if needed, but ok to hold for now and see info below on adjustment disorder, and managing stressors below.  I will recheck liver tests, good luck tomorrow!  Preventive Care 66-59 Years Old, Male Preventive care refers to lifestyle choices and visits with your health care provider that can promote health and wellness. Preventive care visits are also called wellness exams. What can I expect for my preventive care visit? Counseling During your preventive care visit, your health care provider may ask about your: Medical history, including: Past medical problems. Family medical history. Current health, including: Emotional well-being. Home life and relationship well-being. Sexual activity. Lifestyle, including: Alcohol, nicotine or tobacco, and drug use. Access to firearms. Diet, exercise, and sleep habits. Safety issues such as seatbelt and bike helmet use. Sunscreen use. Work and work Astronomer. Physical exam Your health care provider will check your: Height and weight. These may be used to calculate your BMI (body mass index). BMI is a measurement that tells if you are at a healthy weight. Waist circumference. This measures the distance around your waistline. This measurement also tells if you are at a healthy weight and may help predict your risk of certain diseases, such as type 2 diabetes and high blood pressure. Heart rate and blood pressure. Body temperature. Skin for abnormal spots. What immunizations do I need?  Vaccines are usually given at various ages, according to a schedule. Your health care provider will recommend vaccines for you based on your age, medical history, and lifestyle or other factors, such as travel or where you work. What tests do I need? Screening Your health care provider may recommend screening tests for certain conditions. This may include: Lipid and cholesterol levels. Diabetes screening. This  is done by checking your blood sugar (glucose) after you have not eaten for a while (fasting). Hepatitis B test. Hepatitis C test. HIV (human immunodeficiency virus) test. STI (sexually transmitted infection) testing, if you are at risk. Lung cancer screening. Prostate cancer screening. Colorectal cancer screening. Talk with your health care provider about your test results, treatment options, and if necessary, the need for more tests. Follow these instructions at home: Eating and drinking  Eat a diet that includes fresh fruits and vegetables, whole grains, lean protein, and low-fat dairy products. Take vitamin and mineral supplements as recommended by your health care provider. Do not drink alcohol if your health care provider tells you not to drink. If you drink alcohol: Limit how much you have to 0-2 drinks a day. Know how much alcohol is in your drink. In the [place] one drink equals one 12 oz bottle of beer (355 mL), one 5 oz glass of wine (148 mL), or one 1 oz glass of hard liquor (44 mL). Lifestyle Brush your teeth every morning and night with fluoride toothpaste. Floss one time each day. Exercise for at least 30 minutes 5 or more days each week. Do not use any products that contain nicotine or tobacco. These products include cigarettes, chewing tobacco, and vaping devices, such as e-cigarettes. If you need help quitting, ask your health care provider. Do not use drugs. If you are sexually active, practice safe sex. Use a condom or other form of protection to prevent STIs. Take aspirin only as told by your health care provider. Make sure that you understand how much to take and what form to take. Work with your health care provider to find out whether it is safe and  beneficial for you to take aspirin daily. Find healthy ways to manage stress, such as: Meditation, yoga, or listening to music. Journaling. Talking to a trusted person. Spending time with friends and family. Minimize  exposure to UV radiation to reduce your risk of skin cancer. Safety Always wear your seat belt while driving or riding in a vehicle. Do not drive: If you have been drinking alcohol. Do not ride with someone who has been drinking. When you are tired or distracted. While texting. If you have been using any mind-altering substances or drugs. Wear a helmet and other protective equipment during sports activities. If you have firearms in your house, make sure you follow all gun safety procedures. What's next? Go to your health care provider once a year for an annual wellness visit. Ask your health care provider how often you should have your eyes and teeth checked. Stay up to date on all vaccines. This information is not intended to replace advice given to you by your health care provider. Make sure you discuss any questions you have with your health care provider. Document Revised: 11/05/2020 Document Reviewed: 11/05/2020 Elsevier Patient Education  2024 Elsevier Inc.   Managing Stress, Adult Feeling a certain amount of stress is normal. Stress helps our body and mind get ready to deal with the demands of life. Stress hormones can motivate you to do well at work and meet your responsibilities. But severe or long-term (chronic) stress can affect your mental and physical health. Chronic stress puts you at higher risk for: Anxiety and depression. Other health problems such as digestive problems, muscle aches, heart disease, high blood pressure, and stroke. What are the causes? Common causes of stress include: Demands from work, such as deadlines, feeling overworked, or having long hours. Pressures at home, such as money issues, disagreements with a spouse, or parenting issues. Pressures from major life changes, such as divorce, moving, loss of a loved one, or chronic illness. You may be at higher risk for stress-related problems if you: Do not get enough sleep. Are in poor health. Do not have  emotional support. Have a mental health disorder such as anxiety or depression. How to recognize stress Stress can make you: Have trouble sleeping. Feel sad, anxious, irritable, or overwhelmed. Lose your appetite. Overeat or want to eat unhealthy foods. Want to use drugs or alcohol. Stress can also cause physical symptoms, such as: Sore, tense muscles, especially in the shoulders and neck. Headaches. Trouble breathing. A faster heart rate. Stomach pain, nausea, or vomiting. Diarrhea or constipation. Trouble concentrating. Follow these instructions at home: Eating and drinking Eat a healthy diet. This includes: Eating foods that are high in fiber, such as beans, whole grains, and fresh fruits and vegetables. Limiting foods that are high in fat and processed sugars, such as fried or sweet foods. Do not skip meals or overeat. Drink enough fluid to keep your urine pale yellow. Alcohol use Do not drink alcohol if: Your health care provider tells you not to drink. You are pregnant, may be pregnant, or are planning to become pregnant. Drinking alcohol is a way some people try to ease their stress. This can be dangerous, so if you drink alcohol: Limit how much you have to: 0-1 drink a day for women. 0-2 drinks a day for men. Know how much alcohol is in your drink. In the [place] one drink equals one 12 oz bottle of beer (355 mL), one 5 oz glass of wine (148 mL), or one 1 oz  glass of hard liquor (44 mL). Activity  Include 30 minutes of exercise in your daily schedule. Exercise is a good stress reducer. Include time in your day for an activity that you find relaxing. Try taking a walk, going on a bike ride, reading a book, or listening to music. Schedule your time in a way that lowers stress, and keep a regular schedule. Focus on doing what is most important to get done. Lifestyle Identify the source of your stress and your reaction to it. See a therapist who can help you change unhelpful  reactions. When there are stressful events: Talk about them with family, friends, or coworkers. Try to think realistically about stressful events and not ignore them or overreact. Try to find the positives in a stressful situation and not focus on the negatives. Cut back on responsibilities at work and home, if possible. Ask for help from friends or family members if you need it. Find ways to manage stress, such as: Mindfulness, meditation, or deep breathing. Yoga or tai chi. Progressive muscle relaxation. Spending time in nature. Doing art, playing music, or reading. Making time for fun activities. Spending time with family and friends. Get support from family, friends, or spiritual resources. General instructions Get enough sleep. Try to go to sleep and get up at about the same time every day. Take over-the-counter and prescription medicines only as told by your health care provider. Do not use any products that contain nicotine or tobacco. These products include cigarettes, chewing tobacco, and vaping devices, such as e-cigarettes. If you need help quitting, ask your health care provider. Do not use drugs or smoke to deal with stress. Keep all follow-up visits. This is important. Where to find support Talk with your health care provider about stress management or finding a support group. Find a therapist to work with you on your stress management techniques. Where to find more information [employer] on Mental Illness: www.nami.org American Psychological Association: [employer] Contact a health care provider if: Your stress symptoms get worse. You are unable to manage your stress at home. You are struggling to stop using drugs or alcohol. Get help right away if: You may be a danger to yourself or others. You have any thoughts of death or suicide. Get help right awayif you feel like you may hurt yourself or others, or have thoughts about taking your own life. Go to your  nearest emergency room or: Call 911. Call the National Suicide Prevention Lifeline at (336)725-4571 or 988 in the [place] This is open 24 hours a day. Text the Crisis Text Line at 413-809-1697. Summary Feeling a certain amount of stress is normal, but severe or long-term (chronic) stress can affect your mental and physical health. Chronic stress can put you at higher risk for anxiety, depression, and other health problems such as digestive problems, muscle aches, heart disease, high blood pressure, and stroke. You may be at higher risk for stress-related problems if you do not get enough sleep, are in poor health, lack emotional support, or have a mental health disorder such as anxiety or depression. Identify the source of your stress and your reaction to it. Try talking about stressful events with family, friends, or coworkers, finding a coping method, or getting support from spiritual resources. If you need more help, talk with your health care provider about finding a support group or a mental health therapist. This information is not intended to replace advice given to you by your health care provider. Make sure you  discuss any questions you have with your health care provider. Document Revised: 12/04/2020 Document Reviewed: 12/02/2020 Elsevier Patient Education  2024 Elsevier Inc.   Adjustment Disorder, Adult Adjustment disorder is a group of symptoms that can develop after a stressful life event, such as the loss of a job or a serious physical illness. The symptoms can affect how you feel, think, and act. They may also interfere with your relationships. Adjustment disorder increases your risk of suicide and substance abuse. If adjustment disorder is not managed early, it can make medical conditions that you already have worse. If the stressful life event persists, the disorder may continue and become a persistent form of adjustment disorder. What are the causes? This condition is caused by  difficulty recovering from or coping with a stressful life event. What increases the risk? You are more likely to develop this condition if: You have had previous problems coping with life stressors. You are being treated for a long-term (chronic) illness. You are being treated for an illness that cannot be cured (terminal illness). You have a family history of mental illness. What are the signs or symptoms? Symptoms of this condition include: Behavioral symptoms such as: Trouble doing daily tasks. Reckless driving. Poor work International aid/development worker. Ignoring bills. Avoiding family and friends. Impulsive actions. Emotional symptoms such as: Sadness, depression, or crying spells. Worrying a lot, or feeling nervous or anxious. Loss of enjoyment. Feelings of loss or hopelessness. Irritability. Thoughts of suicide. Physical symptoms such as: Change in appetite or weight. Complaining of feeling sick without being ill. Feeling dazed or disconnected. Nightmares. Trouble sleeping. Symptoms of this condition start within 3 months of the stressful event. They do not last more than 6 months, unless the stressful circumstances last longer. Normal grieving after the death of a loved one is not a symptom of this condition. How is this diagnosed? To diagnose this condition, your health care provider will ask about what has happened in your life and how it has affected you. He or she may also ask about your medical history and your use of medicines, alcohol, and other substances. Your health care provider may do a physical exam and order lab tests or other studies. You may be referred to a mental health specialist. How is this treated? Treatment options for this condition include: Counseling or talk therapy. Talk therapy is usually provided by mental health specialists. This therapy may be individual or may involve family members. Medicines. Certain medicines may help with depression, anxiety, and  sleep. Support groups. These offer emotional support, advice, and guidance. They are made up of people who have had similar experiences. Observation and time. This is sometimes called watchful waiting. In this treatment, health care providers monitor your health and behavior without other treatment. Adjustment disorder sometimes gets better on its own with time. Follow these instructions at home: Take over-the-counter and prescription medicines only as told by your health care provider. Keep all follow-up visits. This is important. Contact trusted family and friends for support. Let them know what is going on with you and how they can help. Contact a health care provider if: Your symptoms do not improve in 6 months. Your symptoms get worse. Get help right away if: You have serious thoughts about hurting yourself or someone else. If you ever feel like you may hurt yourself or others, or have thoughts about taking your own life, get help right away. Go to your nearest emergency department or: Call your local emergency services (911 in the  [place] Call a suicide crisis helpline, such as the National Suicide Prevention Lifeline at 920-164-3720 or 988 in the [place] This is open 24 hours a day in the [place] Text the Crisis Text Line at 385-633-4079 (in the [place] Summary Adjustment disorder is a group of symptoms that can develop after a stressful life event, such as the loss of a job or a serious physical illness. The symptoms can affect how you feel, think, and act. They may interfere with your relationships. Symptoms of this condition start within 3 months of the stressful event. They do not last more than 6 months, unless the stressful circumstances last longer. Treatment may include talk therapy, medicines, participation in a support group, or observation to see if symptoms improve. Contact your health care provider if your symptoms get worse or do not improve in 6 months. If you ever feel like you may  hurt yourself or others, or have thoughts about taking your own life, get help right away. This information is not intended to replace advice given to you by your health care provider. Make sure you discuss any questions you have with your health care provider. Document Revised: 12/03/2020 Document Reviewed: 09/21/2019 Elsevier Patient Education  2024 [employer].

## 2023-03-04 ENCOUNTER — Encounter: Payer: Self-pay | Admitting: Urology

## 2023-03-04 ENCOUNTER — Encounter
Admission: RE | Disposition: A | Discharge status: Home / Self Care | Payer: Self-pay | Source: Home / Self Care | Attending: Urology

## 2023-03-04 ENCOUNTER — Ambulatory Visit: Payer: Commercial Managed Care - PPO | Admitting: Urgent Care

## 2023-03-04 ENCOUNTER — Other Ambulatory Visit: Payer: Self-pay

## 2023-03-04 ENCOUNTER — Encounter: Payer: Self-pay | Admitting: Family Medicine

## 2023-03-04 ENCOUNTER — Other Ambulatory Visit (HOSPITAL_BASED_OUTPATIENT_CLINIC_OR_DEPARTMENT_OTHER): Payer: Self-pay

## 2023-03-04 ENCOUNTER — Ambulatory Visit
Admission: RE | Admit: 2023-03-04 | Discharge: 2023-03-04 | Disposition: A | Discharge status: Home / Self Care | Payer: Commercial Managed Care - PPO | Attending: Urology | Admitting: Urology

## 2023-03-04 DIAGNOSIS — N390 Urinary tract infection, site not specified: Secondary | ICD-10-CM | POA: Insufficient documentation

## 2023-03-04 DIAGNOSIS — I2510 Atherosclerotic heart disease of native coronary artery without angina pectoris: Secondary | ICD-10-CM | POA: Diagnosis not present

## 2023-03-04 DIAGNOSIS — C61 Malignant neoplasm of prostate: Secondary | ICD-10-CM | POA: Diagnosis not present

## 2023-03-04 DIAGNOSIS — N138 Other obstructive and reflux uropathy: Secondary | ICD-10-CM | POA: Diagnosis not present

## 2023-03-04 DIAGNOSIS — I10 Essential (primary) hypertension: Secondary | ICD-10-CM | POA: Insufficient documentation

## 2023-03-04 DIAGNOSIS — E785 Hyperlipidemia, unspecified: Secondary | ICD-10-CM | POA: Diagnosis not present

## 2023-03-04 DIAGNOSIS — N401 Enlarged prostate with lower urinary tract symptoms: Secondary | ICD-10-CM

## 2023-03-04 DIAGNOSIS — K219 Gastro-esophageal reflux disease without esophagitis: Secondary | ICD-10-CM | POA: Diagnosis not present

## 2023-03-04 DIAGNOSIS — F419 Anxiety disorder, unspecified: Secondary | ICD-10-CM | POA: Insufficient documentation

## 2023-03-04 HISTORY — DX: Other obstructive and reflux uropathy: N40.1

## 2023-03-04 HISTORY — DX: Other cervical disc degeneration, unspecified cervical region: M50.30

## 2023-03-04 HISTORY — DX: Personal history of other medical treatment: Z92.89

## 2023-03-04 HISTORY — DX: Pulmonary fibrosis, unspecified: J84.10

## 2023-03-04 HISTORY — PX: HOLEP-LASER ENUCLEATION OF THE PROSTATE WITH MORCELLATION: SHX6641

## 2023-03-04 HISTORY — DX: Chest pain, unspecified: R07.9

## 2023-03-04 SURGERY — ENUCLEATION, PROSTATE, USING LASER, WITH MORCELLATION
Anesthesia: General

## 2023-03-04 MED ORDER — OXYBUTYNIN CHLORIDE ER 5 MG PO TB24
ORAL_TABLET | ORAL | Status: AC
  Filled 2023-03-04: qty 2

## 2023-03-04 MED ORDER — DROPERIDOL 2.5 MG/ML IJ SOLN: 0.6250 mg | Freq: Once | INTRAMUSCULAR | Status: DC | PRN

## 2023-03-04 MED ORDER — FENTANYL CITRATE (PF) 100 MCG/2ML IJ SOLN: 25.0000 ug | INTRAMUSCULAR | Status: DC | PRN

## 2023-03-04 MED ORDER — OXYCODONE HCL 5 MG/5ML PO SOLN: 5.0000 mg | Freq: Once | ORAL | Status: DC | PRN

## 2023-03-04 MED ORDER — ACETAMINOPHEN 10 MG/ML IV SOLN: 1000.0000 mg | Freq: Once | INTRAVENOUS | Status: DC | PRN

## 2023-03-04 MED ORDER — FENTANYL CITRATE (PF) 100 MCG/2ML IJ SOLN
INTRAMUSCULAR | Status: AC
  Filled 2023-03-04: qty 2

## 2023-03-04 MED ORDER — FAMOTIDINE 20 MG PO TABS
ORAL_TABLET | ORAL | Status: AC
  Filled 2023-03-04: qty 1

## 2023-03-04 MED ORDER — ONDANSETRON HCL 4 MG/2ML IJ SOLN
INTRAMUSCULAR | Status: DC | PRN
  Administered 2023-03-04: 4 mg via INTRAVENOUS

## 2023-03-04 MED ORDER — CHLORHEXIDINE GLUCONATE 0.12 % MT SOLN
OROMUCOSAL | Status: AC
  Filled 2023-03-04: qty 15

## 2023-03-04 MED ORDER — SUGAMMADEX SODIUM 200 MG/2ML IV SOLN
INTRAVENOUS | Status: DC | PRN
  Administered 2023-03-04: 200 mg via INTRAVENOUS

## 2023-03-04 MED ORDER — MIDAZOLAM HCL 2 MG/2ML IJ SOLN
INTRAMUSCULAR | Status: DC | PRN
  Administered 2023-03-04: 2 mg via INTRAVENOUS

## 2023-03-04 MED ORDER — ROCURONIUM BROMIDE 100 MG/10ML IV SOLN
INTRAVENOUS | Status: DC | PRN
  Administered 2023-03-04: 50 mg via INTRAVENOUS

## 2023-03-04 MED ORDER — OXYCODONE HCL 5 MG PO TABS: 5.0000 mg | ORAL_TABLET | Freq: Once | ORAL | Status: DC | PRN

## 2023-03-04 MED ORDER — PHENYLEPHRINE 80 MCG/ML (10ML) SYRINGE FOR IV PUSH (FOR BLOOD PRESSURE SUPPORT)
PREFILLED_SYRINGE | INTRAVENOUS | Status: DC | PRN
  Administered 2023-03-04: 80 ug via INTRAVENOUS
  Administered 2023-03-04: 160 ug via INTRAVENOUS

## 2023-03-04 MED ORDER — LIDOCAINE HCL (CARDIAC) PF 100 MG/5ML IV SOSY
PREFILLED_SYRINGE | INTRAVENOUS | Status: DC | PRN
  Administered 2023-03-04: 60 mg via INTRAVENOUS

## 2023-03-04 MED ORDER — TRAMADOL HCL 50 MG PO TABS
25.0000 mg | ORAL_TABLET | Freq: Four times a day (QID) | ORAL | 0 refills | Status: AC | PRN
Start: 2023-03-04 — End: 2023-03-09
  Filled 2023-03-04: qty 15, 2d supply, fill #0

## 2023-03-04 MED ORDER — EPHEDRINE SULFATE-NACL 50-0.9 MG/10ML-% IV SOSY
PREFILLED_SYRINGE | INTRAVENOUS | Status: DC | PRN
  Administered 2023-03-04 (×2): 10 mg via INTRAVENOUS
  Administered 2023-03-04: 5 mg via INTRAVENOUS

## 2023-03-04 MED ORDER — OXYBUTYNIN CHLORIDE ER 10 MG PO TB24
10.0000 mg | ORAL_TABLET | Freq: Every day | ORAL | 0 refills | Status: AC
  Filled 2023-03-04: qty 10, 10d supply, fill #0

## 2023-03-04 MED ORDER — SODIUM CHLORIDE 0.9 % IR SOLN
Status: DC | PRN
  Administered 2023-03-04: 6000 mL
  Administered 2023-03-04: 3000 mL
  Administered 2023-03-04: 6000 mL

## 2023-03-04 MED ORDER — OXYBUTYNIN CHLORIDE ER 5 MG PO TB24
10.0000 mg | ORAL_TABLET | Freq: Once | ORAL | Status: AC
  Administered 2023-03-04: 10 mg via ORAL

## 2023-03-04 MED ORDER — PROPOFOL 10 MG/ML IV BOLUS
INTRAVENOUS | Status: DC | PRN
  Administered 2023-03-04: 170 mg via INTRAVENOUS
  Administered 2023-03-04: 10 mg via INTRAVENOUS

## 2023-03-04 MED ORDER — FENTANYL CITRATE (PF) 100 MCG/2ML IJ SOLN
INTRAMUSCULAR | Status: DC | PRN
  Administered 2023-03-04 (×2): 50 ug via INTRAVENOUS

## 2023-03-04 MED ORDER — DEXAMETHASONE SODIUM PHOSPHATE 10 MG/ML IJ SOLN
INTRAMUSCULAR | Status: DC | PRN
  Administered 2023-03-04: 10 mg via INTRAVENOUS

## 2023-03-04 MED ORDER — PROPOFOL 10 MG/ML IV BOLUS
INTRAVENOUS | Status: AC
  Filled 2023-03-04: qty 20

## 2023-03-04 MED ORDER — MIDAZOLAM HCL 2 MG/2ML IJ SOLN
INTRAMUSCULAR | Status: AC
  Filled 2023-03-04: qty 2

## 2023-03-04 MED ORDER — CEFAZOLIN SODIUM-DEXTROSE 2-4 GM/100ML-% IV SOLN
INTRAVENOUS | Status: AC
  Filled 2023-03-04: qty 100

## 2023-03-04 SURGICAL SUPPLY — 35 items
ADAPTER IRRIG TUBE 2 SPIKE SOL (ADAPTER) ×4 IMPLANT
ADPR TBG 2 SPK PMP STRL ASCP (ADAPTER) ×2
BAG DRN LRG CPC RND TRDRP CNTR (MISCELLANEOUS) ×1
BAG DRN RND TRDRP ANRFLXCHMBR (UROLOGICAL SUPPLIES) ×1
BAG URINE DRAIN 2000ML AR STRL (UROLOGICAL SUPPLIES) ×2 IMPLANT
BAG URO DRAIN 4000ML (MISCELLANEOUS) ×2 IMPLANT
CATH FOLEY 3WAY 30CC 24FR (CATHETERS) ×1
CATH URETL OPEN END 4X70 (CATHETERS) ×2 IMPLANT
CATH URTH STD 24FR FL 3W 2 (CATHETERS) ×2 IMPLANT
CONTAINER COLLECT MORCELLATR (MISCELLANEOUS) ×2 IMPLANT
DRAPE UTILITY 15X26 TOWEL STRL (DRAPES) IMPLANT
FIBER LASER MOSES 550 DFL (Laser) ×2 IMPLANT
FILTER OVERFLOW MORCELLATOR (FILTER) ×2 IMPLANT
GLOVE BIOGEL PI IND STRL 7.5 (GLOVE) ×2 IMPLANT
GOWN STRL REUS W/ TWL LRG LVL3 (GOWN DISPOSABLE) ×2 IMPLANT
GOWN STRL REUS W/ TWL XL LVL3 (GOWN DISPOSABLE) ×2 IMPLANT
GOWN STRL REUS W/TWL LRG LVL3 (GOWN DISPOSABLE) ×1
GOWN STRL REUS W/TWL XL LVL3 (GOWN DISPOSABLE) ×1
HOLDER FOLEY CATH W/STRAP (MISCELLANEOUS) ×2 IMPLANT
IV NS IRRIG 3000ML ARTHROMATIC (IV SOLUTION) ×10 IMPLANT
KIT TURNOVER CYSTO (KITS) ×2 IMPLANT
MBRN O SEALING YLW 17 FOR INST (MISCELLANEOUS) ×1
MEMBRANE SLNG YLW 17 FOR INST (MISCELLANEOUS) ×2 IMPLANT
MORCELLATOR COLLECT CONTAINER (MISCELLANEOUS) ×1
MORCELLATOR OVERFLOW FILTER (FILTER) ×1
MORCELLATOR ROTATION 4.75 335 (MISCELLANEOUS) ×2 IMPLANT
PACK CYSTO AR (MISCELLANEOUS) ×2 IMPLANT
SET CYSTO W/LG BORE CLAMP LF (SET/KITS/TRAYS/PACK) ×2 IMPLANT
SET IRRIG Y TYPE TUR BLADDER L (SET/KITS/TRAYS/PACK) ×2 IMPLANT
SLEEVE PROTECTION STRL DISP (MISCELLANEOUS) ×4 IMPLANT
SURGILUBE 2OZ TUBE FLIPTOP (MISCELLANEOUS) ×2 IMPLANT
SYR TOOMEY IRRIG 70ML (MISCELLANEOUS) ×1
SYRINGE TOOMEY IRRIG 70ML (MISCELLANEOUS) ×2 IMPLANT
TUBE PUMP MORCELLATOR PIRANHA (TUBING) ×2 IMPLANT
WATER STERILE IRR 1000ML POUR (IV SOLUTION) ×2 IMPLANT

## 2023-03-04 NOTE — Op Note (Signed)
Date of procedure: 03/04/23  Preoperative diagnosis:  BPH with obstruction  Postoperative diagnosis:  Same  Procedure: HoLEP (Holmium Laser Enucleation of the Prostate)  Surgeon: Legrand Rams, MD  Anesthesia: General  Complications: None  Intraoperative findings:  Mild to moderate bladder trabeculations, uncomplicated HOLEP Ureteral orifices and verumontanum intact at conclusion of case  EBL: Minimal  Specimens: Prostate chips  Enucleation time: 25 minutes  Morcellation time: 13 minutes  Intra-op weight: 66g  Drains: 24 French three-way, 60 cc in balloon  Indication: Eugene Brown is a 57 y.o. patient with BPH and recurrent urinary retention, UTIs, negative prostate biopsy and prostate MRI who opted for HOLEP.  After reviewing the management options for treatment, they elected to proceed with the above surgical procedure(s). We have discussed the potential benefits and risks of the procedure, side effects of the proposed treatment, the likelihood of the patient achieving the goals of the procedure, and any potential problems that might occur during the procedure or recuperation.  We specifically discussed the risks of bleeding, infection, hematuria and clot retention, need for additional procedures, possible overnight hospital stay, temporary urgency and incontinence, rare long-term incontinence, and retrograde ejaculation.  Informed consent has been obtained.   Description of procedure:  The patient was taken to the operating room and general anesthesia was induced.  The patient was placed in the dorsal lithotomy position, prepped and draped in the usual sterile fashion, and preoperative antibiotics(Ancef) were administered.  SCDs were placed for DVT prophylaxis.  A preoperative time-out was performed.   [name] sounds were used to gently dilated the urethra up to 15F. The 65 French continuous flow resectoscope was inserted into the urethra using the visual obturator   The prostate was large with obstructing lateral lobes and an elevated bladder neck. The bladder was thoroughly inspected and notable for mild to moderate trabeculations but no suspicious lesions.  The ureteral orifices were located in orthotopic position.    The laser was set to 2 J and 60 Hz and early apical release was performed by making a circumferential mucosal incision proximal to the sphincter.  A lambda incision was then made proximal to the verumontanum.  The prostate was enucleated en bloc circumferentially into the bladder.  The capsule was examined and laser was used for meticulous hemostasis.    The 41 French resectoscope was then switched out for the 26 French nephroscope and prostate tissue was morcellated(Piranha) and the tissue sent to pathology.  A 24 French three-way catheter was inserted easily with the aid of a catheter guide, and 60 cc were placed in the balloon.  Urine was clear.  The catheter irrigated easily with a Toomey syringe.  CBI was initiated.   The patient tolerated the procedure well without any immediate complications and was extubated and transferred to the recovery room in stable condition.  Urine was clear on fast CBI.  Disposition: Stable to PACU  Plan: Wean CBI in PACU, anticipate discharge home today with Foley removal in clinic in 2-3 days  Legrand Rams, MD 03/04/2023

## 2023-03-04 NOTE — Discharge Instructions (Addendum)

## 2023-03-04 NOTE — Anesthesia Postprocedure Evaluation (Signed)
Anesthesia Post Note  Patient: Eugene Brown  Procedure(s) Performed: HOLEP-LASER ENUCLEATION OF THE PROSTATE WITH MORCELLATION  Patient location during evaluation: PACU Anesthesia Type: General Level of consciousness: awake and alert Pain management: pain level controlled Vital Signs Assessment: post-procedure vital signs reviewed and stable Respiratory status: spontaneous breathing, nonlabored ventilation, respiratory function stable and patient connected to nasal cannula oxygen Cardiovascular status: blood pressure returned to baseline and stable Postop Assessment: no apparent nausea or vomiting Anesthetic complications: no   No notable events documented.   Last Vitals:  Vitals:   03/04/23 1000 03/04/23 1015  BP: 126/82 (!) 140/87  Pulse: 88 69  Resp: 14 13  Temp:  (!) 36.2 C  SpO2: 97% 96%    Last Pain:  Vitals:   03/04/23 1015  TempSrc:   PainSc: 0-No pain                 Yevette Edwards

## 2023-03-04 NOTE — Anesthesia Preprocedure Evaluation (Signed)
Anesthesia Evaluation  Patient identified by MRN, date of birth, ID band Patient awake    Reviewed: Allergy & Precautions, H&P , NPO status , Patient's Chart, lab work & pertinent test results, reviewed documented beta blocker date and time   Airway Mallampati: II  TM Distance: >3 FB Neck ROM: full    Dental  (+) Teeth Intact   Pulmonary neg pulmonary ROS   Pulmonary exam normal        Cardiovascular Exercise Tolerance: Good hypertension, On Medications + CAD  Normal cardiovascular exam Rhythm:regular Rate:Normal     Neuro/Psych negative neurological ROS  negative psych ROS   GI/Hepatic negative GI ROS, Neg liver ROS,GERD  Medicated,,  Endo/Other  negative endocrine ROS    Renal/GU negative Renal ROS  negative genitourinary   Musculoskeletal   Abdominal   Peds  Hematology negative hematology ROS (+)   Anesthesia Other Findings Past Medical History: No date: Aortic atherosclerosis (HCC) 06/09/2021: Ascending aorta dilatation (HCC)     Comment:  a.) TTE 06/09/2021: Ao root 38 mm, asc Ao 40 mm; b.) TTE              12/03/2022: Ao root 39 mm No date: BPH with urinary obstruction 12/06/2022: CAD (coronary artery disease)     Comment:  a.) cCTA 12/06/2022: Ca2+ = 169 (78th %'ile for               age/sex/race match control) No date: Chest pain No date: DDD (degenerative disc disease), cervical     Comment:  a.) s/p ACDF 1998 No date: GERD (gastroesophageal reflux disease) No date: Granulomatous lung disease (HCC)     Comment:  a.) CXR 04/29/2006: 8 x 6 mm RML calcefic density; b.)               CXR 07/30/2008: 7 mm calcified lung nodule on the RIGHT               compatible with granuloma; c.) CXR 01/27/2023: RIGHT lung              calcified granuloma is less apparent since 2010 No date: History of echocardiogram     Comment:  a.) TTE 06/09/2021: EF 60-65%, no RWMAs, norm RVSF, trim              MR, Ao root 38  mm, asc Ao 40 mm; b.) TTE 12/03/2022: EF               55-60%, no RWMAs, norm RVSF, triv MR, Ao root 39 mm No date: History of kidney stones No date: HTN (hypertension) No date: Hyperlipidemia No date: Palpitations (PAC/PVCs)     Comment:  a.) Zio 06/12/2021: NSR with rare atrial/ventricular               ectopy. no arrythmias or sustained pauses 01/27/2023: Sepsis secondary to UTI (HCC) No date: Transaminitis No date: Urinary retention Past Surgical History: 1998: ANTERIOR CERVICAL DECOMP/DISCECTOMY FUSION; N/A BMI    Body Mass Index: 25.66 kg/m     Reproductive/Obstetrics negative OB ROS                             Anesthesia Physical Anesthesia Plan  ASA: 3  Anesthesia Plan: General ETT   Post-op Pain Management:    Induction:   PONV Risk Score and Plan: 3  Airway Management Planned:   Additional Equipment:   Intra-op Plan:   Post-operative Plan:  Informed Consent: I have reviewed the patients History and Physical, chart, labs and discussed the procedure including the risks, benefits and alternatives for the proposed anesthesia with the patient or authorized representative who has indicated his/her understanding and acceptance.     Dental Advisory Given  Plan Discussed with: CRNA  Anesthesia Plan Comments:        Anesthesia Quick Evaluation

## 2023-03-04 NOTE — Interval H&P Note (Signed)
UROLOGY H&P UPDATE  Agree with prior H&P dated 02/22/2023, 57 year old male with BPH and recurrent urinary retention, UTI, prostate measures 120 g on MRI, history of negative prostate biopsy and no suspicious lesions on MRI.  Cardiac: RRR Lungs: CTA bilaterally  Laterality: N/A Procedure: HOLEP  Urine: Urine culture 9/5 no growth  We discussed the risks and benefits of HoLEP at length.  The procedure requires general anesthesia and takes 1 to 2 hours, and a holmium laser is used to enucleate the prostate and push this tissue into the bladder.  A morcellator is then used to remove this tissue, which is sent for pathology.  The vast majority(>95%) of patients are able to discharge the same day with a catheter in place for 2 to 3 days, and will follow-up in clinic for a voiding trial.  We specifically discussed the risks of bleeding, infection, retrograde ejaculation, temporary urgency and urge incontinence, very low risk of long-term incontinence, urethral stricture/bladder neck contracture, pathologic evaluation of prostate tissue and possible detection of prostate cancer or other malignancy, and possible need for additional procedures.   Sondra Come, MD 03/04/2023

## 2023-03-04 NOTE — Transfer of Care (Signed)
Immediate Anesthesia Transfer of Care Note  Patient: Eugene Brown  Procedure(s) Performed: HOLEP-LASER ENUCLEATION OF THE PROSTATE WITH MORCELLATION  Patient Location: PACU  Anesthesia Type:General  Level of Consciousness: drowsy and patient cooperative  Airway & Oxygen Therapy: Patient Spontanous Breathing and Patient connected to face mask oxygen  Post-op Assessment: Report given to RN, Post -op Vital signs reviewed and stable, and Patient moving all extremities X 4  Post vital signs: Reviewed and stable  Last Vitals:  Vitals Value Taken Time  BP 124/74 03/04/23 0951  Temp    Pulse 81 03/04/23 0953  Resp    SpO2 98 % 03/04/23 0953  Vitals shown include unfiled device data.  Last Pain:  Vitals:   03/04/23 0733  TempSrc: Oral         Complications: No notable events documented.

## 2023-03-04 NOTE — Anesthesia Procedure Notes (Addendum)
Procedure Name: Intubation Date/Time: 03/04/2023 8:43 AM  Performed by: Lanell Matar, CRNAPre-anesthesia Checklist: Patient identified, Emergency Drugs available, Suction available and Patient being monitored Patient Re-evaluated:Patient Re-evaluated prior to induction Oxygen Delivery Method: Circle System Utilized Preoxygenation: Pre-oxygenation with 100% oxygen Induction Type: IV induction Ventilation: Mask ventilation without difficulty Laryngoscope Size: McGraph and 4 Grade View: Grade I Tube type: Oral Tube size: 7.5 mm Number of attempts: 1 Airway Equipment and Method: Stylet and Oral airway Placement Confirmation: ETT inserted through vocal cords under direct vision, positive ETCO2 and breath sounds checked- equal and bilateral Secured at: 23 cm Tube secured with: Tape Dental Injury: Teeth and Oropharynx as per pre-operative assessment  Comments: Atraumatic intubation

## 2023-03-04 NOTE — Anesthesia Postprocedure Evaluation (Signed)
Anesthesia Post Note  Patient: Eugene Brown  Procedure(s) Performed: HOLEP-LASER ENUCLEATION OF THE PROSTATE WITH MORCELLATION  Anesthetic complications: no   No notable events documented.   Last Vitals:  Vitals:   03/04/23 1000 03/04/23 1015  BP: 126/82 (!) 140/87  Pulse: 88 69  Resp: 14 13  Temp:  (!) 36.2 C  SpO2: 97% 96%    Last Pain:  Vitals:   03/04/23 1015  TempSrc:   PainSc: 0-No pain                 Yevette Edwards

## 2023-03-05 ENCOUNTER — Encounter: Payer: Self-pay | Admitting: Urology

## 2023-03-07 ENCOUNTER — Encounter: Payer: Self-pay | Admitting: Physician Assistant

## 2023-03-07 ENCOUNTER — Ambulatory Visit (INDEPENDENT_AMBULATORY_CARE_PROVIDER_SITE_OTHER): Payer: Commercial Managed Care - PPO | Admitting: Physician Assistant

## 2023-03-07 VITALS — BP 144/86 | HR 50 | Temp 98.5°F | Ht 70.0 in | Wt 177.0 lb

## 2023-03-07 DIAGNOSIS — N401 Enlarged prostate with lower urinary tract symptoms: Secondary | ICD-10-CM

## 2023-03-07 DIAGNOSIS — N138 Other obstructive and reflux uropathy: Secondary | ICD-10-CM

## 2023-03-07 NOTE — Progress Notes (Signed)
Catheter Removal  Patient is present today for a catheter removal.  58ml of water was drained from the balloon. A 24FR three-way foley cath was removed from the bladder, no complications were noted. Patient tolerated well.  Performed by: Carman Ching, PA-C   Additional notes: Counseled patient on normal postoperative findings including dysuria, gross hematuria, and urinary urgency/leakage. Counseled patient to begin Kegel exercises 3x10 sets daily to increase urinary control and wear absorbent products as needed for security. Written and verbal resources provided today. Surgical pathology pending; will defer to Dr. Richardo Hanks to share results when available.   Follow up: Return in about 4 months (around 07/08/2023) for Postop f/u with Dr. Richardo Hanks.

## 2023-03-07 NOTE — Patient Instructions (Signed)

## 2023-03-08 LAB — SURGICAL PATHOLOGY

## 2023-04-11 ENCOUNTER — Other Ambulatory Visit: Payer: Self-pay | Admitting: Physician Assistant

## 2023-04-11 ENCOUNTER — Other Ambulatory Visit (HOSPITAL_COMMUNITY): Payer: Self-pay

## 2023-04-11 MED ORDER — METOPROLOL SUCCINATE ER 25 MG PO TB24
25.0000 mg | ORAL_TABLET | Freq: Every day | ORAL | 2 refills | Status: DC
  Filled 2023-04-11: qty 90, 90d supply, fill #0
  Filled 2023-07-15: qty 90, 90d supply, fill #1
  Filled 2023-10-08: qty 90, 90d supply, fill #2

## 2023-05-09 ENCOUNTER — Other Ambulatory Visit (HOSPITAL_COMMUNITY): Payer: Self-pay

## 2023-07-13 ENCOUNTER — Other Ambulatory Visit: Payer: Self-pay

## 2023-07-13 DIAGNOSIS — N138 Other obstructive and reflux uropathy: Secondary | ICD-10-CM

## 2023-07-14 ENCOUNTER — Encounter: Payer: 59 | Admitting: Urology

## 2023-07-14 ENCOUNTER — Ambulatory Visit: Payer: 59 | Admitting: Urology

## 2023-07-14 ENCOUNTER — Other Ambulatory Visit: Payer: Self-pay

## 2023-07-14 VITALS — BP 156/91 | HR 57 | Ht 70.0 in | Wt 178.0 lb

## 2023-07-14 DIAGNOSIS — Z125 Encounter for screening for malignant neoplasm of prostate: Secondary | ICD-10-CM

## 2023-07-14 DIAGNOSIS — Z87438 Personal history of other diseases of male genital organs: Secondary | ICD-10-CM

## 2023-07-14 DIAGNOSIS — C61 Malignant neoplasm of prostate: Secondary | ICD-10-CM | POA: Diagnosis not present

## 2023-07-14 DIAGNOSIS — N138 Other obstructive and reflux uropathy: Secondary | ICD-10-CM | POA: Diagnosis not present

## 2023-07-14 DIAGNOSIS — N401 Enlarged prostate with lower urinary tract symptoms: Secondary | ICD-10-CM | POA: Diagnosis not present

## 2023-07-14 LAB — BLADDER SCAN AMB NON-IMAGING

## 2023-07-14 NOTE — Progress Notes (Signed)
   07/14/2023 12:08 PM   Azim Gillingham Riedl 30-Jun-1965 [id number]  Reason for visit: Follow up BPH and retention status post HOLEP   HPI: 58 year old male originally referred by Dr. Cardell Peach for consideration of HoLEP.  He also had a long history of elevated PSA(13) with negative prostate biopsy and negative MRI.  He had problems with recurrent urinary retention as well as UTIs, prostate measured 120 g.  He underwent HOLEP on 03/04/2023 with removal of 73 g of tissue with incidental finding of 1% Gleason score 3+3=6 grade group 1 prostate cancer.  He is doing extremely well since surgery.  He is urinating with a strong stream, denies any incontinence.  PVR today is normal at 37ml.   We again reviewed his incidental finding of very low volume low risk prostate cancer that can be monitored with active surveillance, will draw PSA today and call with those results, continue yearly PSA monitoring.  He can return to care with Dr. Cardell Peach locally in [place], we are happy to see him again if any issues in the future.  PSA today, call with results Can return to yearly follow-up with Dr. Cardell Peach for PSA monitoring of low volume low risk prostate cancer    Sondra Come, MD  [facility] [address] [place], [place] [address] [phone]

## 2023-07-15 ENCOUNTER — Other Ambulatory Visit (HOSPITAL_COMMUNITY): Payer: Self-pay

## 2023-07-15 LAB — PSA: Prostate Specific Ag, Serum: 2 ng/mL (ref 0.0–4.0)

## 2023-08-23 ENCOUNTER — Other Ambulatory Visit: Payer: Self-pay | Admitting: Physician Assistant

## 2023-08-23 ENCOUNTER — Other Ambulatory Visit: Payer: Self-pay

## 2023-08-23 ENCOUNTER — Other Ambulatory Visit (HOSPITAL_COMMUNITY): Payer: Self-pay

## 2023-08-23 ENCOUNTER — Other Ambulatory Visit: Payer: Self-pay | Admitting: Family Medicine

## 2023-08-23 DIAGNOSIS — I10 Essential (primary) hypertension: Secondary | ICD-10-CM

## 2023-08-23 MED ORDER — LOSARTAN POTASSIUM 50 MG PO TABS
50.0000 mg | ORAL_TABLET | Freq: Every day | ORAL | 3 refills | Status: AC
  Filled 2023-08-23: qty 90, 90d supply, fill #0
  Filled 2023-11-27: qty 90, 90d supply, fill #1
  Filled 2024-02-23: qty 90, 90d supply, fill #2
  Filled 2024-05-22: qty 90, 90d supply, fill #3

## 2023-08-23 MED ORDER — ROSUVASTATIN CALCIUM 10 MG PO TABS
10.0000 mg | ORAL_TABLET | Freq: Every day | ORAL | 0 refills | Status: DC
  Filled 2023-08-23 – 2023-11-01 (×2): qty 90, 90d supply, fill #0

## 2023-09-01 ENCOUNTER — Ambulatory Visit: Payer: Commercial Managed Care - PPO | Admitting: Family Medicine

## 2023-09-01 VITALS — BP 126/74 | HR 87 | Temp 98.0°F | Ht 69.0 in | Wt 187.4 lb

## 2023-09-01 DIAGNOSIS — Z23 Encounter for immunization: Secondary | ICD-10-CM

## 2023-09-01 DIAGNOSIS — Z1211 Encounter for screening for malignant neoplasm of colon: Secondary | ICD-10-CM | POA: Diagnosis not present

## 2023-09-01 DIAGNOSIS — I10 Essential (primary) hypertension: Secondary | ICD-10-CM | POA: Diagnosis not present

## 2023-09-01 DIAGNOSIS — R7989 Other specified abnormal findings of blood chemistry: Secondary | ICD-10-CM | POA: Diagnosis not present

## 2023-09-01 DIAGNOSIS — E785 Hyperlipidemia, unspecified: Secondary | ICD-10-CM

## 2023-09-01 NOTE — Patient Instructions (Signed)
 Thank you for coming in today.  Cologuard was ordered.  Tdap and pneumonia vaccines given today.  Please have labs performed in the next week if possible, make sure to be well-hydrated prior to those labs and if possible fasting labs but that is not critical.  No med changes at this time.  Follow-up in 6 months for physical but let me know if there are questions in the meantime.  Take care!  [facility] or xray: Walk in 8:30-4:30 during weekdays, no appointment needed [address]  [place], [place] [address]

## 2023-09-01 NOTE — Progress Notes (Unsigned)
 Subjective:  Patient ID: Eugene Brown, male    DOB: December 24, 1965  Age: 58 y.o. MRN: [id number]  CC:  Chief Complaint  Patient presents with   Medical Management of Chronic Issues    Pt is well, no concerns from patient     HPI Eugene Brown presents for    Follow up.  Followed by urology - cleared few weeks ago for activity, PSA less than 0.2, no further need for proscar or flomax. Normal urination.   Hypertension: Toprol-XL 25mg  every day. losartan 50mg  every day.  Home readings:  113/66, 127/70. No side effects or orthostatic symptoms.   BP Readings from Last 3 Encounters:  09/01/23 126/74  07/14/23 (!) 156/91  03/07/23 (!) 144/86   Lab Results  Component Value Date   CREATININE 0.93 01/31/2023   Hyperlipidemia: Crestor 10 mg daily. No myalgias/side effects.last ate 4 hrs ago.  Prior elevated lfts. No abd pain, n/v, scleral icterus or jaundice.  Tough stick for blood in past - plans labs at [facility] with hydration prior.   Lab Results  Component Value Date   CHOL 129 01/17/2023   HDL 56 01/17/2023   LDLCALC 59 01/17/2023   LDLDIRECT 160 (H) 11/08/2022   TRIG 66 01/17/2023   CHOLHDL 2.3 01/17/2023   Lab Results  Component Value Date   ALT 245 (H) 01/31/2023   AST 57 (H) 01/31/2023   ALKPHOS 95 01/31/2023   BILITOT 0.5 01/31/2023   HM: Screening options with colonoscopy versus Cologuard discussed. Discussed timing of repeat testing intervals if normal, as well as potential need for diagnostic Colonoscopy if positive Cologuard. Understanding expressed, and chose Cologuard.  No prior hep C screen.  Covid vaccine - no recent booster. Declines shingrix.  Prevnar 20 today.   Immunization History  Administered Date(s) Administered   Influenza Inj Mdck Quad Pf 02/12/2022        History Patient Active Problem List   Diagnosis Date Noted   Sepsis secondary to UTI (HCC) 01/27/2023   Transaminitis 01/27/2023   PREMATURE VENTRICULAR CONTRACTIONS  08/27/2008   Essential hypertension 08/20/2008   GERD 08/20/2008   PALPITATIONS 08/02/2008   CHEST PAIN-UNSPECIFIED 08/02/2008   Past Medical History:  Diagnosis Date   Aortic atherosclerosis (HCC)    Ascending aorta dilatation (HCC) 06/09/2021   a.) TTE 06/09/2021: Ao root 38 mm, asc Ao 40 mm; b.) TTE 12/03/2022: Ao root 39 mm   BPH with urinary obstruction    CAD (coronary artery disease) 12/06/2022   a.) cCTA 12/06/2022: Ca2+ = 169 (78th %'ile for age/sex/race match control)   Chest pain    DDD (degenerative disc disease), cervical    a.) s/p ACDF 1998   GERD (gastroesophageal reflux disease)    Granulomatous lung disease (HCC)    a.) CXR 04/29/2006: 8 x 6 mm RML calcefic density; b.) CXR 07/30/2008: 7 mm calcified lung nodule on the RIGHT compatible with granuloma; c.) CXR 01/27/2023: RIGHT lung calcified granuloma is less apparent since 2010   History of echocardiogram    a.) TTE 06/09/2021: EF 60-65%, no RWMAs, norm RVSF, trim MR, Ao root 38 mm, asc Ao 40 mm; b.) TTE 12/03/2022: EF 55-60%, no RWMAs, norm RVSF, triv MR, Ao root 39 mm   History of kidney stones    HTN (hypertension)    Hyperlipidemia    Palpitations (PAC/PVCs)    a.) Zio 06/12/2021: NSR with rare atrial/ventricular ectopy. no arrythmias or sustained pauses   Sepsis secondary to UTI (HCC) 01/27/2023  Transaminitis    Urinary retention    Past Surgical History:  Procedure Laterality Date   ANTERIOR CERVICAL DECOMP/DISCECTOMY FUSION N/A 1998   HOLEP-LASER ENUCLEATION OF THE PROSTATE WITH MORCELLATION N/A 03/04/2023   Procedure: HOLEP-LASER ENUCLEATION OF THE PROSTATE WITH MORCELLATION;  Surgeon: Sondra Come, MD;  Location: [facility] ORS;  Service: Urology;  Laterality: N/A;   No Known Allergies Prior to Admission medications   Medication Sig Start Date End Date Taking? Authorizing Provider  losartan (COZAAR) 50 MG tablet Take 1 tablet (50 mg total) by mouth daily. 08/23/23  Yes Shade Flood, MD   MAGNESIUM PO Take 1 tablet by mouth at bedtime.   Yes [provider]  metoprolol succinate (TOPROL XL) 25 MG 24 hr tablet Take 1 tablet (25 mg total) by mouth daily. 04/11/23  Yes Dunn, Dayna N, PA-C  Multiple Vitamin (MULTIVITAMIN) tablet Take 1 tablet by mouth at bedtime.   Yes [provider]  rosuvastatin (CRESTOR) 10 MG tablet Take 1 tablet (10 mg total) by mouth daily. 08/23/23  Yes Dunn, Tacey Ruiz, PA-C   Social History   Socioeconomic History   Marital status: Married    Spouse name: Not on file   Number of children: 2   Years of education: Not on file   Highest education level: Master's degree (e.g., MA, MS, MEng, MEd, MSW, MBA)  Occupational History   Occupation: Doctor, general practice at [employer]  Tobacco Use   Smoking status: Never    Passive exposure: Never   Smokeless tobacco: Never   Tobacco comments:    no tobacco   Vaping Use   Vaping status: Never Used  Substance and Sexual Activity   Alcohol use: Yes    Comment: rare beer   Drug use: No   Sexual activity: Yes  Other Topics Concern   Not on file  Social History Narrative   Married, 2 children.    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    No significant caffeine use; no otc supplements    Social Drivers of Corporate investment banker Strain: Low Risk  (09/01/2023)   Overall Financial Resource Strain (CARDIA)    Difficulty of Paying Living Expenses: Not hard at all  Food Insecurity: No Food Insecurity (09/01/2023)   Hunger Vital Sign    Worried About Running Out of Food in the Last Year: Never true    Ran Out of Food in the Last Year: Never true  Transportation Needs: No Transportation Needs (09/01/2023)   PRAPARE - Administrator, Civil Service (Medical): No    Lack of Transportation (Non-Medical): No  Physical Activity: Insufficiently Active (09/01/2023)   Exercise Vital Sign    Days of Exercise per Week: 5 days    Minutes of Exercise per Session: 20 min  Stress: Stress  Concern Present (09/01/2023)   [employer] of Occupational Health - Occupational Stress Questionnaire    Feeling of Stress : To some extent  Social Connections: Socially Integrated (09/01/2023)   Social Connection and Isolation Panel [NHANES]    Frequency of Communication with Friends and Family: More than three times a week    Frequency of Social Gatherings with Friends and Family: Once a week    Attends Religious Services: More than 4 times per year    Active Member of [employer] or Organizations: Yes    Attends Engineer, structural: More than 4 times per year    Marital Status: Married  Catering manager  Violence: Not At Risk (01/27/2023)   Humiliation, Afraid, Rape, and Kick questionnaire    Fear of Current or Ex-Partner: No    Emotionally Abused: No    Physically Abused: No    Sexually Abused: No    Review of Systems  Constitutional:  Negative for fatigue and unexpected weight change.  Eyes:  Negative for visual disturbance.  Respiratory:  Negative for cough, chest tightness and shortness of breath.   Cardiovascular:  Negative for chest pain, palpitations and leg swelling.  Gastrointestinal:  Negative for abdominal pain and blood in stool.  Neurological:  Negative for dizziness, light-headedness and headaches.     Objective:   Vitals:   09/01/23 1550  BP: 126/74  Pulse: 87  Temp: 98 F (36.7 C)  TempSrc: Temporal  SpO2: 97%  Weight: 187 lb 6.4 oz (85 kg)  Height: 5\' 9"  (1.753 m)     Physical Exam Vitals reviewed.  Constitutional:      Appearance: He is well-developed.  HENT:     Head: Normocephalic and atraumatic.  Neck:     Vascular: No carotid bruit or JVD.  Cardiovascular:     Rate and Rhythm: Normal rate and regular rhythm.     Heart sounds: Normal heart sounds. No murmur heard. Pulmonary:     Effort: Pulmonary effort is normal.     Breath sounds: Normal breath sounds. No rales.  Musculoskeletal:     Right lower leg: No edema.     Left lower  leg: No edema.  Skin:    General: Skin is warm and dry.  Neurological:     Mental Status: He is alert and oriented to person, place, and time.  Psychiatric:        Mood and Affect: Mood normal.        Assessment & Plan:  Eugene Brown is a 58 y.o. male . Hypertension, essential - Plan: Comprehensive metabolic panel with GFR, CANCELED: Comprehensive metabolic panel with GFR  Hyperlipidemia, unspecified hyperlipidemia type - Plan: Comprehensive metabolic panel with GFR, Lipid panel, CANCELED: Comprehensive metabolic panel with GFR, CANCELED: Lipid panel  Elevated LFTs  Need for diphtheria-tetanus-pertussis (Tdap) vaccine - Plan: Tdap vaccine greater than or equal to 7yo IM  Need for pneumococcal vaccination - Plan: Pneumococcal conjugate vaccine 20-valent (Prevnar 20)  Screen for colon cancer - Plan: Cologuard   No orders of the defined types were placed in this encounter.  Patient Instructions  Thank you for coming in today.  Cologuard was ordered.  Tdap and pneumonia vaccines given today.  Please have labs performed in the next week if possible, make sure to be well-hydrated prior to those labs and if possible fasting labs but that is not critical.  No med changes at this time.  Follow-up in 6 months for physical but let me know if there are questions in the meantime.  Take care!  [facility] or xray: Walk in 8:30-4:30 during weekdays, no appointment needed [address]  [place], [place] [address]     Signed,   Meredith Staggers, MD [facility], [facility] 09/01/23 4:56 PM

## 2023-09-02 ENCOUNTER — Encounter: Payer: Self-pay | Admitting: Family Medicine

## 2023-09-05 ENCOUNTER — Telehealth: Payer: Self-pay

## 2023-09-05 NOTE — Telephone Encounter (Signed)
 Patient called requesting PSA results sent to [facility].  Results faxed.

## 2023-09-06 ENCOUNTER — Other Ambulatory Visit (INDEPENDENT_AMBULATORY_CARE_PROVIDER_SITE_OTHER)

## 2023-09-06 DIAGNOSIS — I10 Essential (primary) hypertension: Secondary | ICD-10-CM

## 2023-09-06 DIAGNOSIS — E785 Hyperlipidemia, unspecified: Secondary | ICD-10-CM

## 2023-09-06 LAB — LIPID PANEL
Cholesterol: 123 mg/dL (ref 0–200)
HDL: 38.5 mg/dL — ABNORMAL LOW (ref >39.00)
LDL Cholesterol: 62 mg/dL (ref 0–99)
NonHDL: 84.49
Total CHOL/HDL Ratio: 3
Triglycerides: 113 mg/dL (ref 0.0–149.0)
VLDL: 22.6 mg/dL (ref 0.0–40.0)

## 2023-09-06 LAB — COMPREHENSIVE METABOLIC PANEL WITH GFR
ALT: 54 U/L — ABNORMAL HIGH (ref 0–53)
AST: 32 U/L (ref 0–37)
Albumin: 4.8 g/dL (ref 3.5–5.2)
Alkaline Phosphatase: 61 U/L (ref 39–117)
BUN: 23 mg/dL (ref 6–23)
CO2: 24 meq/L (ref 19–32)
Calcium: 9.6 mg/dL (ref 8.4–10.5)
Chloride: 106 meq/L (ref 96–112)
Creatinine, Ser: 1.09 mg/dL (ref 0.40–1.50)
GFR: 73.93 mL/min (ref >60.00)
Glucose, Bld: 104 mg/dL — ABNORMAL HIGH (ref 70–99)
Potassium: 4.2 meq/L (ref 3.5–5.1)
Sodium: 139 meq/L (ref 135–145)
Total Bilirubin: 0.5 mg/dL (ref 0.2–1.2)
Total Protein: 6.9 g/dL (ref 6.0–8.3)

## 2023-09-07 ENCOUNTER — Encounter: Payer: Self-pay | Admitting: Family Medicine

## 2023-11-01 ENCOUNTER — Other Ambulatory Visit (HOSPITAL_COMMUNITY): Payer: Self-pay

## 2023-11-18 ENCOUNTER — Encounter (HOSPITAL_COMMUNITY): Payer: Self-pay | Admitting: Physician Assistant

## 2024-01-05 ENCOUNTER — Other Ambulatory Visit (HOSPITAL_COMMUNITY): Payer: Self-pay

## 2024-01-05 ENCOUNTER — Other Ambulatory Visit: Payer: Self-pay | Admitting: Physician Assistant

## 2024-01-05 MED ORDER — ROSUVASTATIN CALCIUM 10 MG PO TABS
10.0000 mg | ORAL_TABLET | Freq: Every day | ORAL | 0 refills | Status: DC
  Filled 2024-01-05 – 2024-01-28 (×2): qty 90, 90d supply, fill #0

## 2024-01-05 MED ORDER — METOPROLOL SUCCINATE ER 25 MG PO TB24
25.0000 mg | ORAL_TABLET | Freq: Every day | ORAL | 0 refills | Status: DC
  Filled 2024-01-13: qty 90, 90d supply, fill #0
  Filled ????-??-??: fill #0

## 2024-01-13 ENCOUNTER — Other Ambulatory Visit (HOSPITAL_COMMUNITY): Payer: Self-pay

## 2024-01-16 ENCOUNTER — Other Ambulatory Visit (HOSPITAL_COMMUNITY): Payer: Self-pay

## 2024-01-29 ENCOUNTER — Other Ambulatory Visit (HOSPITAL_COMMUNITY): Payer: Self-pay

## 2024-01-31 ENCOUNTER — Other Ambulatory Visit (HOSPITAL_COMMUNITY): Payer: Self-pay

## 2024-04-08 ENCOUNTER — Other Ambulatory Visit: Payer: Self-pay | Admitting: Physician Assistant

## 2024-04-09 DIAGNOSIS — L820 Inflamed seborrheic keratosis: Secondary | ICD-10-CM | POA: Diagnosis not present

## 2024-04-10 ENCOUNTER — Other Ambulatory Visit (HOSPITAL_COMMUNITY): Payer: Self-pay

## 2024-04-10 MED ORDER — METOPROLOL SUCCINATE ER 25 MG PO TB24
25.0000 mg | ORAL_TABLET | Freq: Every day | ORAL | 0 refills | Status: AC
  Filled 2024-04-10: qty 90, 90d supply, fill #0

## 2024-04-30 ENCOUNTER — Other Ambulatory Visit: Payer: Self-pay | Admitting: Physician Assistant

## 2024-05-01 ENCOUNTER — Other Ambulatory Visit: Payer: Self-pay

## 2024-05-01 ENCOUNTER — Other Ambulatory Visit (HOSPITAL_COMMUNITY): Payer: Self-pay

## 2024-05-01 MED ORDER — ROSUVASTATIN CALCIUM 10 MG PO TABS
10.0000 mg | ORAL_TABLET | Freq: Every day | ORAL | 0 refills | Status: AC
  Filled 2024-05-01: qty 90, 90d supply, fill #0

## 2024-06-05 ENCOUNTER — Ambulatory Visit: Attending: Cardiovascular Disease | Admitting: Cardiovascular Disease

## 2024-06-05 NOTE — Progress Notes (Unsigned)
 "  No chief complaint on file.  History of Present Illness: 59 yo male with history of CAD, GERD, HTN and palpitations (PACs/PVCs)  who is here today for follow up. He was seen in 2023 as a new patient for evaluation of palpitations and elevated blood pressure. Cardiac monitor in 2010 with PVCs/PACs. At his visit here in January 2023 he described palpitations at night. Cardiac monitor January 2023 with sinus with PVCs, PACs. Echo July 2024 with LVEF=55-60%. Normal RV function. No significant valve disease. Mild dilation of the aortic root at 3.9 cm. CT coronary calcium  score of 169 in July 2024.   He is here today for follow up. The patient denies any chest pain, dyspnea, palpitations, lower extremity edema, orthopnea, PND, dizziness, near syncope or syncope.   Primary Care Physician: Levora Reyes SAUNDERS, MD  Past Medical History:  Diagnosis Date   Aortic atherosclerosis    Ascending aorta dilatation 06/09/2021   a.) TTE 06/09/2021: Ao root 38 mm, asc Ao 40 mm; b.) TTE 12/03/2022: Ao root 39 mm   BPH with urinary obstruction    CAD (coronary artery disease) 12/06/2022   a.) cCTA 12/06/2022: Ca2+ = 169 (78th %'ile for age/sex/race match control)   Chest pain    DDD (degenerative disc disease), cervical    a.) s/p ACDF 1998   GERD (gastroesophageal reflux disease)    Granulomatous lung disease (HCC)    a.) CXR 04/29/2006: 8 x 6 mm RML calcefic density; b.) CXR 07/30/2008: 7 mm calcified lung nodule on the RIGHT compatible with granuloma; c.) CXR 01/27/2023: RIGHT lung calcified granuloma is less apparent since 2010   History of echocardiogram    a.) TTE 06/09/2021: EF 60-65%, no RWMAs, norm RVSF, trim MR, Ao root 38 mm, asc Ao 40 mm; b.) TTE 12/03/2022: EF 55-60%, no RWMAs, norm RVSF, triv MR, Ao root 39 mm   History of kidney stones    HTN (hypertension)    Hyperlipidemia    Palpitations (PAC/PVCs)    a.) Zio 06/12/2021: NSR with rare atrial/ventricular ectopy. no arrythmias or sustained  pauses   Sepsis secondary to UTI (HCC) 01/27/2023   Transaminitis    Urinary retention     Past Surgical History:  Procedure Laterality Date   ANTERIOR CERVICAL DECOMP/DISCECTOMY FUSION N/A 1998   HOLEP-LASER ENUCLEATION OF THE PROSTATE WITH MORCELLATION N/A 03/04/2023   Procedure: HOLEP-LASER ENUCLEATION OF THE PROSTATE WITH MORCELLATION;  Surgeon: Francisca Redell BROCKS, MD;  Location: [facility] ORS;  Service: Urology;  Laterality: N/A;    Current Outpatient Medications  Medication Sig Dispense Refill   losartan  (COZAAR ) 50 MG tablet Take 1 tablet (50 mg total) by mouth daily. 90 tablet 3   MAGNESIUM PO Take 1 tablet by mouth at bedtime.     metoprolol  succinate (TOPROL  XL) 25 MG 24 hr tablet Take 1 tablet (25 mg total) by mouth daily. 90 tablet 0   Multiple Vitamin (MULTIVITAMIN) tablet Take 1 tablet by mouth at bedtime.     rosuvastatin  (CRESTOR ) 10 MG tablet Take 1 tablet (10 mg total) by mouth daily. 90 tablet 0   No current facility-administered medications for this visit.    No Known Allergies  Social History   Socioeconomic History   Marital status: Married    Spouse name: Not on file   Number of children: 2   Years of education: Not on file   Highest education level: Master's degree (e.g., MA, MS, MEng, MEd, MSW, MBA)  Occupational History   Occupation: Physicians  Assistant at [employer]  Tobacco Use   Smoking status: Never    Passive exposure: Never   Smokeless tobacco: Never   Tobacco comments:    no tobacco   Vaping Use   Vaping status: Never Used  Substance and Sexual Activity   Alcohol use: Yes    Comment: rare beer   Drug use: No   Sexual activity: Yes  Other Topics Concern   Not on file  Social History Narrative   Married, 2 children.    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    No significant caffeine use; no otc supplements    Social Drivers of Health   Tobacco Use: Low Risk (09/02/2023)   Patient History    Smoking Tobacco Use: Never    Smokeless  Tobacco Use: Never    Passive Exposure: Never  Financial Resource Strain: Low Risk (09/01/2023)   Overall Financial Resource Strain (CARDIA)    Difficulty of Paying Living Expenses: Not hard at all  Food Insecurity: No Food Insecurity (09/01/2023)   Hunger Vital Sign    Worried About Running Out of Food in the Last Year: Never true    Ran Out of Food in the Last Year: Never true  Transportation Needs: No Transportation Needs (09/01/2023)   PRAPARE - Administrator, Civil Service (Medical): No    Lack of Transportation (Non-Medical): No  Physical Activity: Insufficiently Active (09/01/2023)   Exercise Vital Sign    Days of Exercise per Week: 5 days    Minutes of Exercise per Session: 20 min  Stress: Stress Concern Present (09/01/2023)   [employer] of Occupational Health - Occupational Stress Questionnaire    Feeling of Stress : To some extent  Social Connections: Socially Integrated (09/01/2023)   Social Connection and Isolation Panel    Frequency of Communication with Friends and Family: More than three times a week    Frequency of Social Gatherings with Friends and Family: Once a week    Attends Religious Services: More than 4 times per year    Active Member of [employer] or Organizations: Yes    Attends Engineer, Structural: More than 4 times per year    Marital Status: Married  Catering Manager Violence: Not At Risk (01/27/2023)   Humiliation, Afraid, Rape, and Kick questionnaire    Fear of Current or Ex-Partner: No    Emotionally Abused: No    Physically Abused: No    Sexually Abused: No  Depression (PHQ2-9): Low Risk (09/01/2023)   Depression (PHQ2-9)    PHQ-2 Score: 0  Alcohol Screen: Low Risk (09/01/2023)   Alcohol Screen    Last Alcohol Screening Score (AUDIT): 3  Housing: Low Risk (09/01/2023)   Housing Stability Vital Sign    Unable to Pay for Housing in the Last Year: No    Number of Times Moved in the Last Year: 0    Homeless in the Last Year: No   Utilities: Not At Risk (01/27/2023)   AHC Utilities    Threatened with loss of utilities: No  Health Literacy: Not on file    Family History  Problem Relation Age of Onset   CAD Mother        CABG   Atrial fibrillation Father     Review of Systems:  As stated in the HPI and otherwise negative.   There were no vitals taken for this visit.  Physical Examination: General: Well developed, well nourished, NAD  SKIN: warm, dry. Neuro:  No focal deficits  Psychiatric: Mood and affect normal  Neck: No JVD Lungs:Clear bilaterally, no wheezes, rhonci, crackles Cardiovascular: Regular rate and rhythm. No murmurs, gallops or rubs. Abdomen:Soft.  Extremities: No lower extremity edema.    EKG:  EKG is not *** ordered today. The ekg ordered today demonstrates   Recent Labs: 09/06/2023: ALT 54; BUN 23; Creatinine, Ser 1.09; Potassium 4.2; Sodium 139   Lipid Panel    Component Value Date/Time   CHOL 123 09/06/2023 0728   CHOL 129 01/17/2023 0719   TRIG 113.0 09/06/2023 0728   HDL 38.50 (L) 09/06/2023 0728   HDL 56 01/17/2023 0719   CHOLHDL 3 09/06/2023 0728   VLDL 22.6 09/06/2023 0728   LDLCALC 62 09/06/2023 0728   LDLCALC 59 01/17/2023 0719   LDLDIRECT 160 (H) 11/08/2022 1002     Wt Readings from Last 3 Encounters:  09/01/23 187 lb 6.4 oz (85 kg)  07/14/23 178 lb (80.7 kg)  03/07/23 177 lb (80.3 kg)    Assessment and Plan:   1. Palpitations/PACs/PVCs:  No palpitations.  -Continue Toprol   2. HTN: BP is well controlled at home.  -Continue Toprol  and Losartan     3. CAD without angina: Abnormal CT coronary calcium  score in 2024. Continue Crestor . LDL at goal in April 2025.   Labs/ tests ordered today include:  No orders of the defined types were placed in this encounter.  Disposition:   F/U with me in one year  Signed, Lonni Cash, MD 06/05/2024 2:15 PM    [facility] [address] [place], [place], [place]  [address] Phone: [phone];  Fax: [phone]    "

## 2024-06-07 ENCOUNTER — Encounter: Payer: Self-pay | Admitting: Cardiovascular Disease

## 2024-06-07 NOTE — Telephone Encounter (Signed)
 Patient was to have echo in one year based on 12/03/22.  Order was cancelled as scheduling person was unable to contact patient.  Call placed to patient.  Left message to call office
